# Patient Record
Sex: Female | Born: 1990 | Race: Black or African American | Hispanic: No | Marital: Single | Smoking: Former smoker
Health system: Southern US, Community
[De-identification: ages and names within clinical notes are randomized; demographics above are authoritative.]

## PROBLEM LIST (undated history)

## (undated) DIAGNOSIS — Z87440 Personal history of urinary (tract) infections: Secondary | ICD-10-CM

## (undated) DIAGNOSIS — Z8619 Personal history of other infectious and parasitic diseases: Secondary | ICD-10-CM

## (undated) DIAGNOSIS — F32A Depression, unspecified: Secondary | ICD-10-CM

## (undated) DIAGNOSIS — F329 Major depressive disorder, single episode, unspecified: Secondary | ICD-10-CM

## (undated) HISTORY — PX: WISDOM TOOTH EXTRACTION: SHX21

## (undated) HISTORY — DX: Personal history of other infectious and parasitic diseases: Z86.19

## (undated) HISTORY — DX: Depression, unspecified: F32.A

## (undated) HISTORY — DX: Personal history of urinary (tract) infections: Z87.440

## (undated) HISTORY — PX: NO PAST SURGERIES: SHX2092

## (undated) HISTORY — DX: Major depressive disorder, single episode, unspecified: F32.9

---
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
## 2008-04-12 IMAGING — CT CT HEAD W/O CM
1 series · 16 of 30 positions shown, 20 images · IV contrast (agent unspecified)
Comparison: none

CLINICAL DATA: Posterior headache. 
 HEAD CT WITHOUT CONTRAST:
TECHNIQUE: Contiguous axial images were obtained from the base of the skull through the vertex according to standard protocol without contrast.

[Series 2: headseq 4.8 h37s · axial · 0.44mm/px · z∈[+102,+231]mm · 16 of 30 slices shown, 20 images]
[im 2/30  brain]
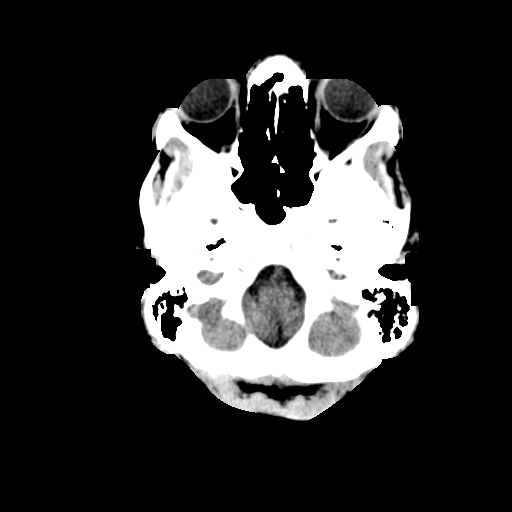
[im 2/30  bone]
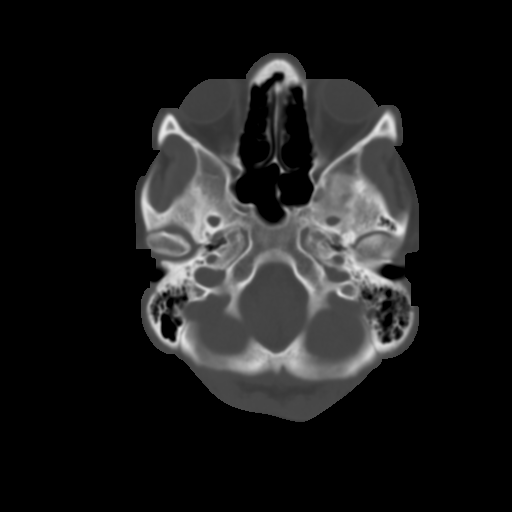
[im 4/30  brain]
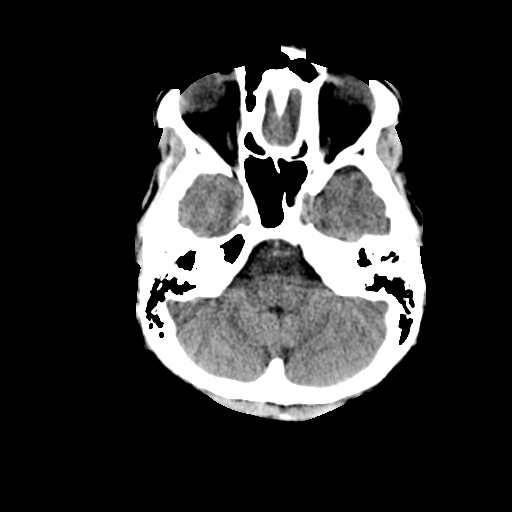
[im 6/30  brain]
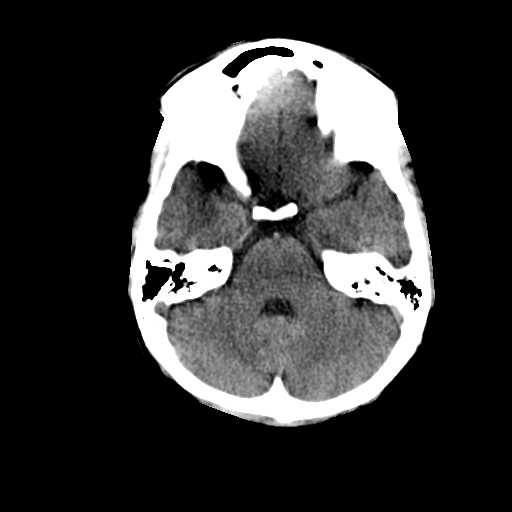
[im 8/30  brain]
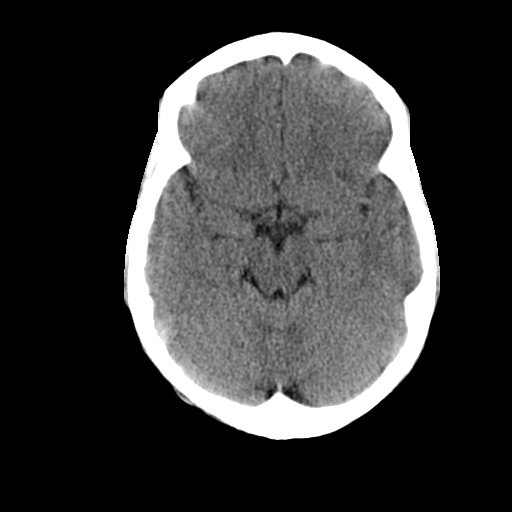
[im 9/30  brain]
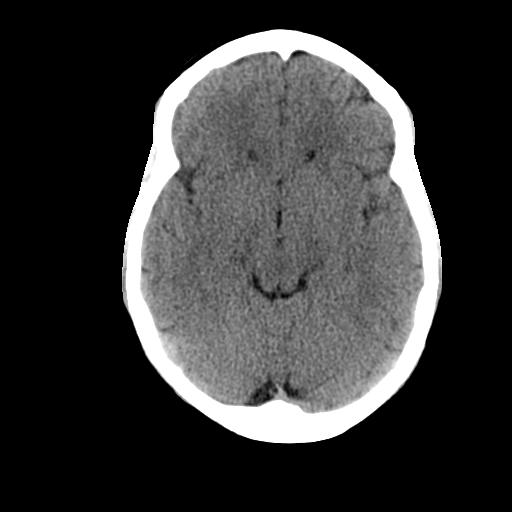
[im 9/30  bone]
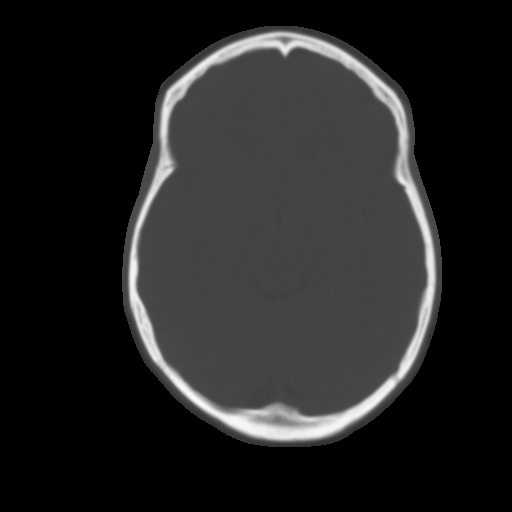
[im 11/30  brain]
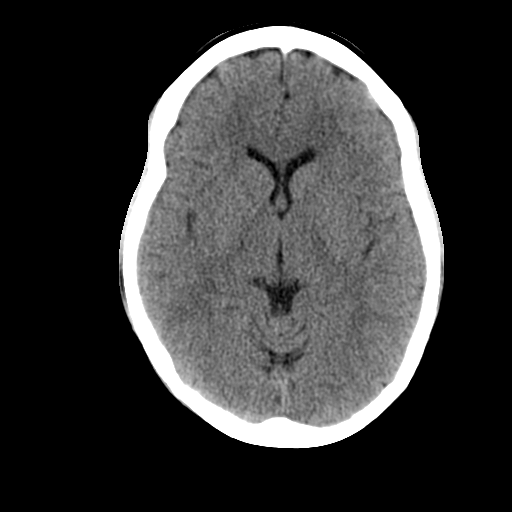
[im 13/30  brain]
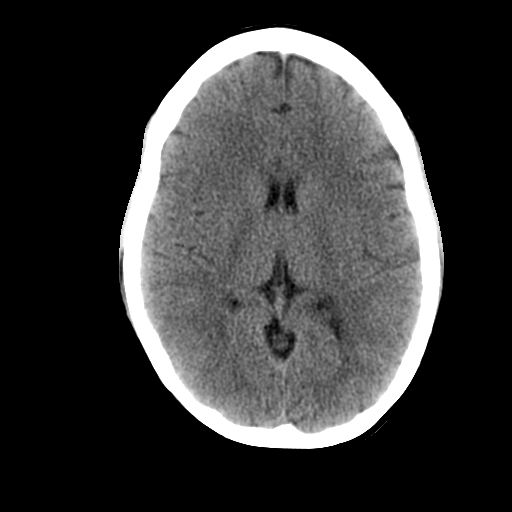
[im 15/30  brain]
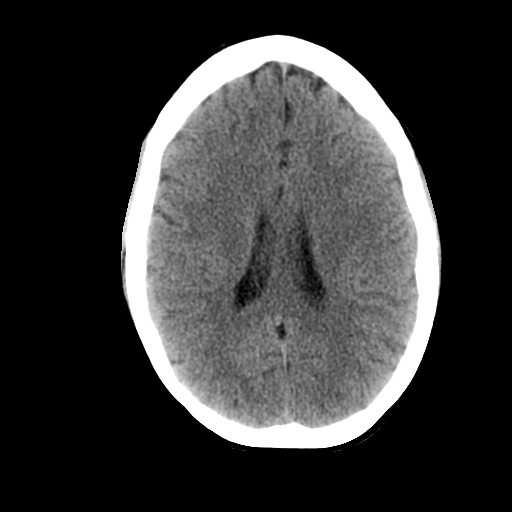
[im 16/30  brain]
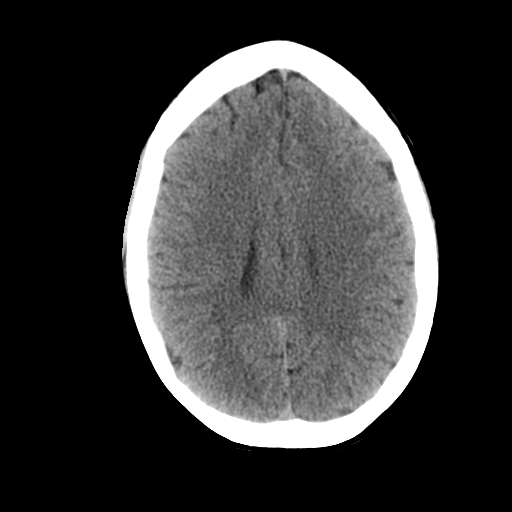
[im 16/30  bone]
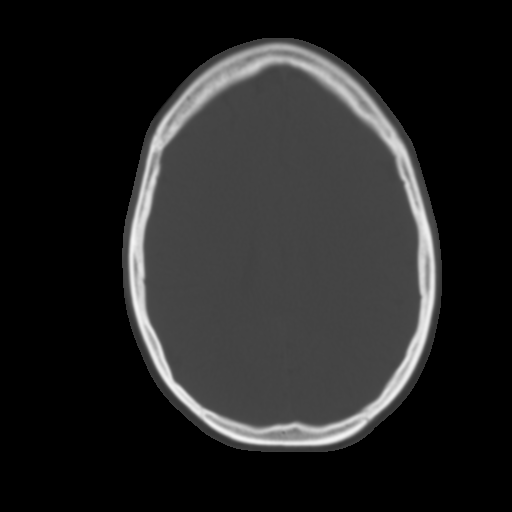
[im 18/30  brain]
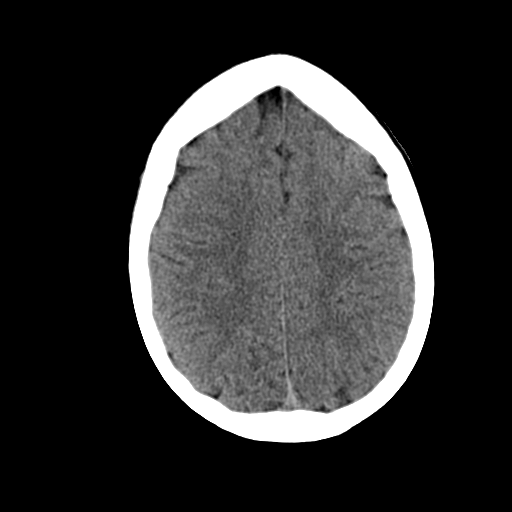
[im 20/30  brain]
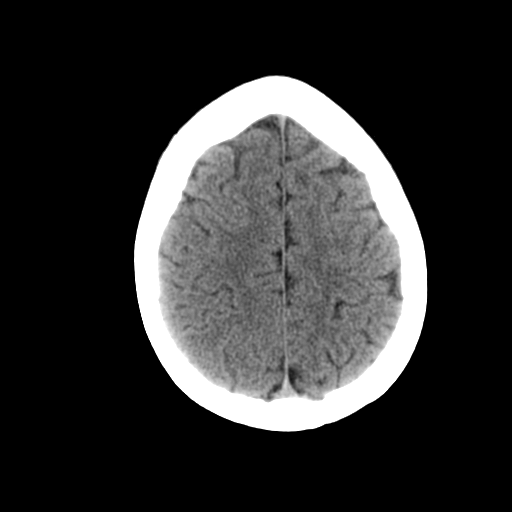
[im 22/30  brain]
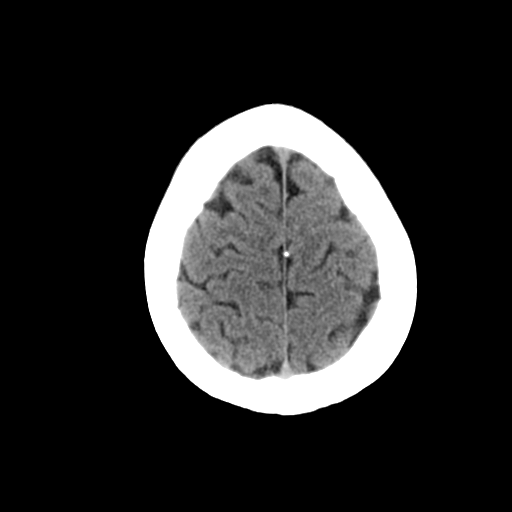
[im 23/30  brain]
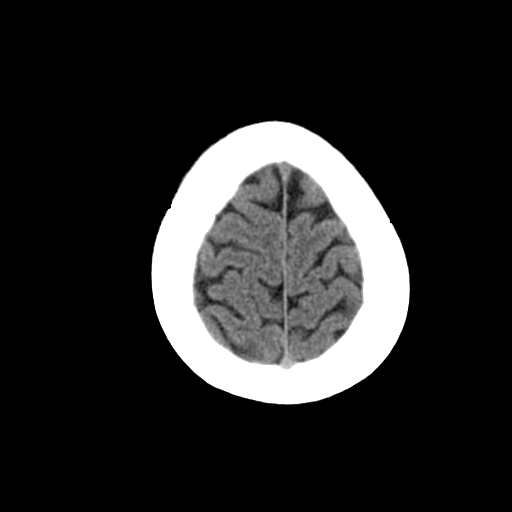
[im 23/30  bone]
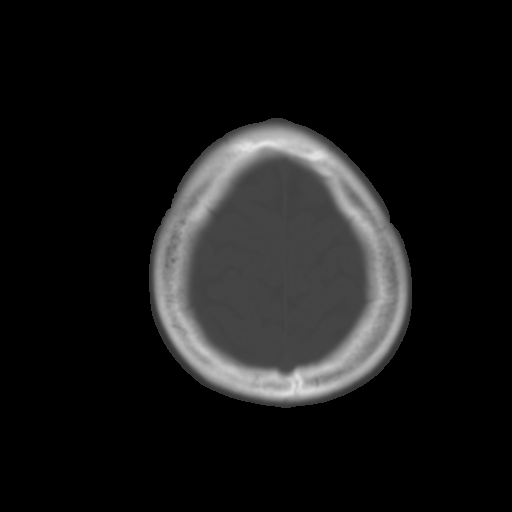
[im 25/30  brain]
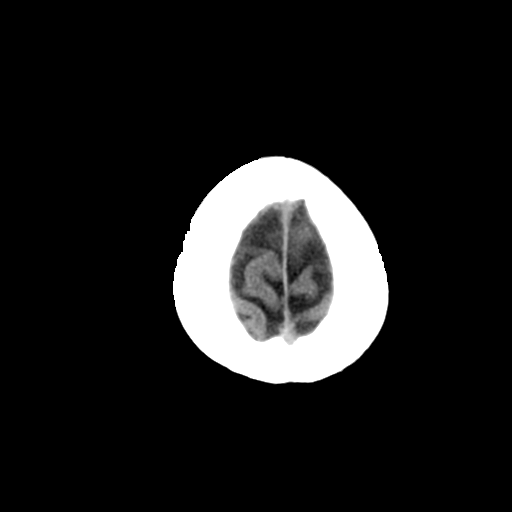
[im 27/30  brain]
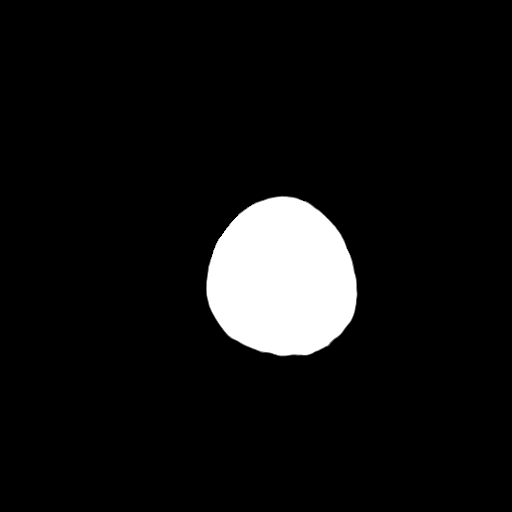
[im 29/30  brain]
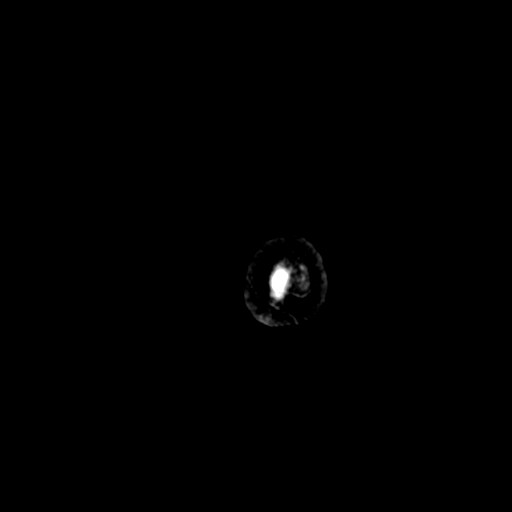

[16 of 30 positions shown; findings below may reference images not displayed]

FINDINGS: There is no evidence of intracranial hemorrhage, brain edema, acute infarct, mass lesion, or mass effect.  No other intra-axial abnormalities are seen, and the ventricles are within normal limits.  No abnormal extra-axial fluid collections or masses are identified.  No skull abnormalities are noted.
IMPRESSION: Negative non-contrast head CT.

## 2011-05-10 LAB — ANTIBODY SCREEN: Antibody Screen: NEGATIVE

## 2011-05-10 LAB — HEPATITIS B SURFACE ANTIGEN: Hepatitis B Surface Ag: NEGATIVE

## 2011-05-10 LAB — ABO/RH: RH Type: NEGATIVE

## 2011-05-10 LAB — RPR: RPR: NONREACTIVE

## 2011-06-04 LAB — STREP B DNA PROBE: GBS: POSITIVE

## 2011-06-05 NOTE — L&D Delivery Note (Signed)
Requested to attend urgent C/S for fetal interest and malpresentation of twin B in breech position. Mother has been on antenatal unit for several weeks and received BMZ during that interval. She presented with preterm labor and bulging bag of water and has been on Magnesium sulfate and Pen G initially and then restarted on Magnesium sulfate this AM for neuroprotection.  Twins are Di-Di.  At delivery twin A was in vertex presentation and was manually expressed with lusty cries and active tone observed. Placed under radiant warmer and vigorously dried and provided tactile stimulation and bulb suction of naso/oropharynx yielding minimal fluid.     Infant maintained spontaneous respirations without assistance until about 3 1/2-4 minutes of age when there were brief apneic events and deeper intercostal retractions. Bag/mask IPPV was briefly applied and then switched to [place] to provide CPAP.  Infant stabilized and continued to move extremities actively. There were no dysmorphic features and infant did not void during stabilization.    Placed in transport isolette with his sibling, infant was shown briefly to mother and then transported accompanied by father  to NICU for admission.   Dagoberto Ligas MD [facility]

## 2011-08-23 ENCOUNTER — Encounter: Payer: Medicaid Other | Admitting: Obstetrics and Gynecology

## 2011-08-23 ENCOUNTER — Inpatient Hospital Stay (HOSPITAL_COMMUNITY)
Admission: AD | Admit: 2011-08-23 | Discharge: 2011-09-08 | DRG: 765 | Disposition: A | Discharge status: Home / Self Care | Payer: Medicaid Other | Source: Ambulatory Visit | Attending: Obstetrics and Gynecology | Admitting: Obstetrics and Gynecology

## 2011-08-23 ENCOUNTER — Encounter (HOSPITAL_COMMUNITY): Payer: Self-pay | Admitting: *Deleted

## 2011-08-23 ENCOUNTER — Encounter: Payer: Self-pay | Admitting: Obstetrics and Gynecology

## 2011-08-23 ENCOUNTER — Encounter (INDEPENDENT_AMBULATORY_CARE_PROVIDER_SITE_OTHER): Payer: Medicaid Other

## 2011-08-23 ENCOUNTER — Other Ambulatory Visit: Payer: Self-pay | Admitting: Obstetrics and Gynecology

## 2011-08-23 DIAGNOSIS — O30049 Twin pregnancy, dichorionic/diamniotic, unspecified trimester: Secondary | ICD-10-CM

## 2011-08-23 DIAGNOSIS — O30009 Twin pregnancy, unspecified number of placenta and unspecified number of amniotic sacs, unspecified trimester: Secondary | ICD-10-CM | POA: Diagnosis present

## 2011-08-23 DIAGNOSIS — O4700 False labor before 37 completed weeks of gestation, unspecified trimester: Secondary | ICD-10-CM

## 2011-08-23 DIAGNOSIS — K5900 Constipation, unspecified: Secondary | ICD-10-CM | POA: Diagnosis present

## 2011-08-23 DIAGNOSIS — D649 Anemia, unspecified: Secondary | ICD-10-CM | POA: Diagnosis present

## 2011-08-23 DIAGNOSIS — O3090 Multiple gestation, unspecified, unspecified trimester: Principal | ICD-10-CM | POA: Diagnosis present

## 2011-08-23 LAB — CBC
HCT: 32.9 % — ABNORMAL LOW (ref 36.0–46.0)
Hemoglobin: 10.9 g/dL — ABNORMAL LOW (ref 12.0–15.0)
MCV: 74.3 fL — ABNORMAL LOW (ref 78.0–100.0)
RBC: 4.43 MIL/uL (ref 3.87–5.11)
RDW: 14.9 % (ref 11.5–15.5)
WBC: 17.4 10*3/uL — ABNORMAL HIGH (ref 4.0–10.5)

## 2011-08-23 MED ORDER — BETAMETHASONE SOD PHOS & ACET 6 (3-3) MG/ML IJ SUSP
12.0000 mg | Freq: Once | INTRAMUSCULAR | Status: AC
Start: 2011-08-23 — End: 2011-08-24
  Administered 2011-08-24: 12 mg via INTRAMUSCULAR
  Filled 2011-08-23: qty 2

## 2011-08-23 MED ORDER — ACETAMINOPHEN 325 MG PO TABS: 650.0000 mg | ORAL_TABLET | ORAL | Status: DC | PRN

## 2011-08-23 MED ORDER — PRENATAL MULTIVITAMIN CH: 1.0000 | ORAL_TABLET | Freq: Every day | ORAL | Status: DC

## 2011-08-23 MED ORDER — PRENATAL MULTIVITAMIN CH
1.0000 | ORAL_TABLET | Freq: Every day | ORAL | Status: DC
  Administered 2011-08-23 – 2011-09-04 (×13): 1 via ORAL
  Filled 2011-08-23 (×13): qty 1

## 2011-08-23 MED ORDER — DOCUSATE SODIUM 100 MG PO CAPS
100.0000 mg | ORAL_CAPSULE | Freq: Every day | ORAL | Status: DC
  Administered 2011-08-23 – 2011-09-04 (×12): 100 mg via ORAL
  Filled 2011-08-23 (×12): qty 1

## 2011-08-23 MED ORDER — CALCIUM CARBONATE ANTACID 500 MG PO CHEW
2.0000 | CHEWABLE_TABLET | ORAL | Status: DC | PRN
  Administered 2011-08-26 – 2011-08-29 (×5): 400 mg via ORAL
  Filled 2011-08-23 (×5): qty 2

## 2011-08-23 MED ORDER — MAGNESIUM SULFATE BOLUS VIA INFUSION
4.0000 g | Freq: Once | INTRAVENOUS | Status: AC
  Administered 2011-08-23: 4 g via INTRAVENOUS
  Filled 2011-08-23: qty 500

## 2011-08-23 MED ORDER — MAGNESIUM SULFATE 40 G IN LACTATED RINGERS - SIMPLE
1.0000 g/h | INTRAVENOUS | Status: DC
  Administered 2011-08-24 – 2011-08-25 (×2): 2.5 g/h via INTRAVENOUS
  Administered 2011-08-26: 2 g/h via INTRAVENOUS
  Administered 2011-08-26: 1.5 g/h via INTRAVENOUS
  Administered 2011-08-26: 1 g/h via INTRAVENOUS
  Filled 2011-08-23 (×4): qty 500

## 2011-08-23 MED ORDER — LACTATED RINGERS IV SOLN
INTRAVENOUS | Status: DC
  Administered 2011-08-23 – 2011-08-26 (×5): via INTRAVENOUS
  Administered 2011-08-26: 100 mL/h via INTRAVENOUS
  Administered 2011-08-27 – 2011-09-05 (×25): via INTRAVENOUS

## 2011-08-23 MED ORDER — PENICILLIN G POTASSIUM 5000000 UNITS IJ SOLR
2.5000 10*6.[IU] | INTRAVENOUS | Status: DC
  Administered 2011-08-23 – 2011-08-28 (×29): 2.5 10*6.[IU] via INTRAVENOUS
  Filled 2011-08-23 (×30): qty 2.5

## 2011-08-23 MED ORDER — BETAMETHASONE SOD PHOS & ACET 6 (3-3) MG/ML IJ SUSP
12.0000 mg | Freq: Once | INTRAMUSCULAR | Status: AC
  Administered 2011-08-23 – 2011-08-24 (×2): 12 mg via INTRAMUSCULAR
  Filled 2011-08-23: qty 2

## 2011-08-23 MED ORDER — DOCUSATE SODIUM 100 MG PO CAPS: 100.0000 mg | ORAL_CAPSULE | Freq: Every day | ORAL | Status: DC

## 2011-08-23 MED ORDER — PENICILLIN G POTASSIUM 5000000 UNITS IJ SOLR
5.0000 10*6.[IU] | Freq: Once | INTRAVENOUS | Status: AC
  Administered 2011-08-23: 5 10*6.[IU] via INTRAVENOUS
  Filled 2011-08-23: qty 5

## 2011-08-23 MED ORDER — ZOLPIDEM TARTRATE 10 MG PO TABS
10.0000 mg | ORAL_TABLET | Freq: Every evening | ORAL | Status: DC | PRN
  Administered 2011-08-23 – 2011-09-04 (×12): 10 mg via ORAL
  Filled 2011-08-23 (×13): qty 1

## 2011-08-23 NOTE — Plan of Care (Signed)
Problem: Consults Goal: Birthing Suites Patient Information Press F2 to bring up selections list   Pt < [redacted] weeks EGA     

## 2011-08-23 NOTE — Progress Notes (Signed)
Patient ID: [place], female   DOB: [birth year], 21 y.o.   MRN: [id number] Pt without complaints.  No leakage of fluid or VB.  Good FM  BP 126/75  Pulse 92  Temp(Src) 98.4 F (36.9 C) (Oral)  Resp 18  Ht 5\' 3"  (1.6 m)  Wt 67.132 kg (148 lb)  BMI 26.22 kg/m2  FHTS Baseline: a 145 b 150 bpm, Variability: Fair (1-6 bpm), Accelerations: Non-reactive but appropriate for gestational age and Decelerations: Absent  Toco regular, every 2-5 minutes  Pt in NAD CV RRR Lungs CTAB abd  Gravid soft and NT GU no vb EXt no calf tenderness Results for orders placed during the hospital encounter of 08/23/11 (from the past 72 hour(s))  CBC     Status: Abnormal   Collection Time   08/23/11  3:00 PM      Component Value Range Comment   WBC 17.4 (*) 4.0 - 10.5 (K/uL)    RBC 4.43  3.87 - 5.11 (MIL/uL)    Hemoglobin 10.9 (*) 12.0 - 15.0 (g/dL)    HCT 40.9 (*) 81.1 - 46.0 (%)    MCV 74.3 (*) 78.0 - 100.0 (fL)    MCH 24.6 (*) 26.0 - 34.0 (pg)    MCHC 33.1  30.0 - 36.0 (g/dL)    RDW 91.4  78.2 - 95.6 (%)    Platelets 267  150 - 400 (K/uL)     Assessment and Plan [redacted]w[redacted]d  increase magnesium sulfate to 2.5 grams per hour.

## 2011-08-23 NOTE — H&P (Signed)
  [name] is April 08, 1991  No LMP recorded. Patient is pregnant. [redacted]w[redacted]d  Pt presents with PTL and twins.  Pt was seen at the office by DR Su Hilt and found to be 4 cm with bulging membranes.    Pregnancy complicated by GBS bacturia History of tobacco, ETOH and and THC use .  All stopped with positive pregnancy test Twins DI/Di PMHX  Past Medical History  Diagnosis Date  . No pertinent past medical history     PSURG HX  Past Surgical History  Procedure Date  . Wisdom tooth extraction   . No past surgeries     Fam HX No family history on file.  Soc HX  History   Social History  . Marital Status: Single    Spouse Name: N/A    Number of Children: N/A  . Years of Education: N/A   Occupational History  . 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 Smoking status: Former Smoker -- 0.5 packs/day for 2 years    Types: Cigarettes    Quit date: 03/25/2011  . Smokeless tobacco: Never Used  . Alcohol Use: No  . Drug Use: No  . Sexually Active: Yes -- Female partner(s)   Other Topics Concern  . Not on file   Social History Narrative  . No narrative on file    ROS  Review of Systems - General ROS: negative Psychological ROS: negative Respiratory ROS: no cough, shortness of breath, or wheezing Cardiovascular ROS: no chest pain or dyspnea on exertion Gastrointestinal ROS: no abdominal pain, change in bowel habits, or black or bloody stools Musculoskeletal ROS: negative    IUP [redacted]w[redacted]d  Admit to antepartum Beta methasone MGSO if contracting Trendelenburg Plan cesarean if patient labors PCNG for known GBS Pt agrees with plan NICU consult  [name] is 1991/05/11   Past Surgical History  Procedure Date  . Wisdom tooth extraction   . No past surgeries     Fam HX No family history on file.  Soc HX  History   Social History  . Marital Status: Single    Spouse Name: N/A    Number of Children: N/A  . Years of Education: N/A    Occupational History  . 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 Smoking status: Former Smoker -- 0.5 packs/day for 2 years    Types: Cigarettes    Quit date: 03/25/2011  . Smokeless tobacco: Never Used  . Alcohol Use: No  . Drug Use: No  . Sexually Active: Yes -- Female partner(s)   Other Topics Concern  . Not on file   Social History Narrative  . No narrative on file    ROS  Review of Systems - History obtained from the patient Respiratory ROS: no cough, shortness of breath, or wheezing Cardiovascular ROS: no chest pain or dyspnea on exertion Gastrointestinal ROS: no abdominal pain, change in bowel habits, or black or bloody stools Musculoskeletal ROS: negative  [place] SIUP a vtx b breech position.  AFI   EFW   %  Gen WDWN  IN NAD CV RRR LUNGS CTAB ABD Gravid soft NT GU bulging membranes on spec exam and cx 4cm per Dr Su Hilt EXT no c/c A/P: IUP [redacted]w[redacted]d twins PTL Beta methasone Magnesium tocolysis if needed Bed rest urnine culture  Admit to antepartum

## 2011-08-24 ENCOUNTER — Encounter: Payer: Medicaid Other | Admitting: Obstetrics and Gynecology

## 2011-08-24 LAB — GC/CHLAMYDIA PROBE AMP, URINE
Chlamydia, Swab/Urine, PCR: NEGATIVE
GC Probe Amp, Urine: NEGATIVE

## 2011-08-24 NOTE — Progress Notes (Signed)
UR Chart review completed.  

## 2011-08-24 NOTE — Progress Notes (Signed)
Patient ID: [place], female   DOB: Jul 19, 1990, 21 y.o.   MRN: [id number] [place] Jerde is a 21 y.o. G1P0000 at [redacted]w[redacted]d admitted for Preterm labor  Hospital Day No: 2  Subjective: No complaints.  Not feeling contractions.  Denies change in discharge, vb and reports FM x 2.  Prenatal labs:  Pregnancy complications: multiple gestation, preterm labor  Objective: BP 108/56  Pulse 125  Temp(Src) 98.4 F (36.9 C) (Oral)  Resp 18  Ht 5\' 3"  (1.6 m)  Wt 148 lb (67.132 kg)  BMI 26.22 kg/m2  SpO2 96% I/O last 3 completed shifts: In: 3892 [P.O.:1400; I.V.:1842; IV Piggyback:650] Out: 2620 [Urine:2620] Total I/O In: 854.9 [P.O.:480; I.V.:374.9] Out: 1100 [Urine:1100]  Physical Exam:  Gen: alert and oriented in trendelenberg Chest/Lungs: cta bilaterally  Heart/Pulse: RRR  Abdomen: soft, gravid, nontender, BX x4 quad Uterine fundus: soft, nontender Skin & Color: warm and dry  Neurological: AOx3, DTRs 1-2+ EXT: negative Homan's b/l, edema neg  FHT:  FHR: 130s-140s bpm, variability: moderate,  accelerations:  Present,  decelerations:  Absent x 2 UC:   irregular SVE:    deferred  Labs: Lab Results  Component Value Date   WBC 17.4* 08/23/2011   HGB 10.9* 08/23/2011   HCT 32.9* 08/23/2011   MCV 74.3* 08/23/2011   PLT 267 08/23/2011    Assessment and Plan: P0 at 26 3/7wks admitted in PTL undergoing BMZ course while on MgSO4 and antibiotic course for GBS +.  Fetal status x 2 is reassuring.  Continue current plan.   Kearstin Learn Y 08/24/2011, 11:59 AM

## 2011-08-24 NOTE — Progress Notes (Signed)
Visited with pt at nurse's referral.  Pt was in good spirits but was frustrated by having to be in an uncomfortable position.  We shared a prayer for her babies, [place] and [place], along with FOB, Reuel Boom.    Please page as needed or as pt requests.  Chaplain Dyanne Carrel 4:17 PM   08/24/11 1600  Clinical Encounter Type  Visited With Patient and family together  Visit Type Spiritual support  Referral From Nurse  Spiritual Encounters  Spiritual Needs Prayer

## 2011-08-24 NOTE — Consult Note (Signed)
Neonatology Consult to Antenatal Patient:  [name] [name] is admitted yesterday at 69 2/[redacted] weeks GA with twin gestation and preterm labor. She is currently 4 cm dilated with a bulging amniotic sac and not  having active labor. She is getting Magnesium sulfate, BMZ and IV Pen G. The infants are both female.  I spoke with the patient alone. We discussed the worst case of delivery in the next 1-2 days, including usual DR management, possible respiratory complications and need for support, IV access, feedings (mother desires breast feeding, which was encouraged), LOS, Mortality and Morbidity, and long term outcomes. She did not have any questions at this time. I offered a NICU tour to any interested family members and would be glad to come back if she has more questions later.  Thank you for asking me to see this patient.  Deatra James, MD Neonatologist  Time spent: 20 minutes [phone])

## 2011-08-25 LAB — URINE CULTURE
Colony Count: NO GROWTH
Culture  Setup Time: 201303220228
Culture: NO GROWTH

## 2011-08-25 MED ORDER — PSEUDOEPHEDRINE HCL 30 MG PO TABS: 30.0000 mg | ORAL_TABLET | Freq: Four times a day (QID) | ORAL | Status: DC | PRN

## 2011-08-25 NOTE — Progress Notes (Signed)
Patient ID: [place], female   DOB: Oct 12, 1990, 21 y.o.   MRN: [id number] [name] [name] is a 21 y.o. G1P0000 at [redacted]w[redacted]d admitted for Preterm labor  Hospital Day No: 3  Subjective: No change in discharge.  Pt felt one contraction this morning and feels a little stuffy in the nose.  Pregnancy complications: preterm labor  Objective: BP 105/60  Pulse 114  Temp(Src) 98 F (36.7 C) (Oral)  Resp 18  Ht 5\' 3"  (1.6 m)  Wt 148 lb (67.132 kg)  BMI 26.22 kg/m2  SpO2 98% I/O last 3 completed shifts: In: 7997.9 [P.O.:2840; I.V.:4257.9; IV Piggyback:900] Out: 8070 [Urine:8070] Total I/O In: 1366.1 [P.O.:960; I.V.:406.1] Out: 950 [Urine:950]  Physical Exam:  Gen: alert and oriented Chest/Lungs: cta bilaterally  Heart/Pulse: RRR  Abdomen: soft, gravid, nontender, BX x4 quad Uterine fundus: soft, nontender Skin & Color: warm and dry  Neurological: AOx3, DTRs 1+ EXT: negative Homan's b/l, edema neg  FHT: 140s bpm, variability: moderate,  accelerations:  Present,  decelerations:  Rare variable (x2) UC:   rare SVE:    deferred  Labs: Lab Results  Component Value Date   WBC 17.4* 08/23/2011   HGB 10.9* 08/23/2011   HCT 32.9* 08/23/2011   MCV 74.3* 08/23/2011   PLT 267 08/23/2011    Assessment and Plan: PTL stable on Mg.  Will decrease to 2g/hr.  FHT overall reassuring x 2.  Will do NSTs q shift and continuous toco.  Sudafed prn for stuffy nose.  Continue strict bedrest.  Will do GCT tomorrow and MFM consult on Monday.  If still pregnant, U/S for EFW on 09/13/11.  Purcell Nails 08/25/2011, 12:53 PM

## 2011-08-26 NOTE — Progress Notes (Signed)
S Lillard notified of FBS results.  No orders

## 2011-08-26 NOTE — Progress Notes (Signed)
5 ounces of glucola given to pt

## 2011-08-26 NOTE — Progress Notes (Addendum)
Patient ID: [place], female   DOB: January 07, 1991, 21 y.o.   MRN: [id number] [name] [name] is a 21 y.o. G1P0000 at [redacted]w[redacted]d admitted for Preterm labor  Hospital Day No: 4  Subjective: No complaints. No change in discharge.  She denies feeling any contractions or leakage of fluid or vb.  + FM.  Pregnancy complications: preterm labor  Objective: BP 116/56  Pulse 111  Temp(Src) 98.3 F (36.8 C) (Oral)  Resp 20  Ht 5\' 3"  (1.6 m)  Wt 144 lb 12.8 oz (65.681 kg)  BMI 25.65 kg/m2  SpO2 99% I/O last 3 completed shifts: In: 7945.5 [P.O.:2940; I.V.:4405.5; IV Piggyback:600] Out: 8775 [Urine:8775] Total I/O In: 1657.1 [P.O.:930; I.V.:627.1; IV Piggyback:100] Out: 1725 [Urine:1725]  Physical Exam:  Gen: alert Chest/Lungs: cta bilaterally  Heart/Pulse: RRR  Abdomen: soft, gravid, nontender, BX x4 quad Uterine fundus: soft, nontender Skin & Color: warm and dry  Neurological: AOx3, DTRs 1+ EXT: negative Homan's b/l, edema neg  FHT:  FHR: 140s-150 bpm, variability: moderate,  accelerations:  Present,  decelerations:  Absent UC:   none SVE:    deferred  Labs: Lab Results  Component Value Date   WBC 17.4* 08/23/2011   HGB 10.9* 08/23/2011   HCT 32.9* 08/23/2011   MCV 74.3* 08/23/2011   PLT 267 08/23/2011    Assessment and Plan: P0 at 26 5/7 wks admitted with PTL and advanced cervical dilation.  Currently stable.  GCT likely falsely elevated s/p 2nd BMZ shot on Friday.  Will repeat 09/08/11.  Will appreciate MFM recs regarding MgSO4 discontinuation tomorrow.  Pt is reluctant to have it stopped for fear of return of contractions and delivery.  She understands secondary to vtx/breech and prematurity that a c-section is recommended.  Will repeat EFW 4/11.  Fetal status x 2 is overall reassuring.  Questions answered.   [name] [name] Y 08/26/2011, 1:29 PM  10:15p After Mg decreased to 1g/hr patient started to complain of increased ctxs with increased pain.  Mg increased to 1.5g/hr.

## 2011-08-26 NOTE — Progress Notes (Signed)
New bed calabrated.  Pt moved to calibrated bed and daily wt taken.

## 2011-08-26 NOTE — Progress Notes (Signed)
Pt states uc's "lighter" now than this AM. (ie less noticeable)

## 2011-08-26 NOTE — Progress Notes (Signed)
cardios removed 

## 2011-08-26 NOTE — Progress Notes (Signed)
Pt feels she may be contracting

## 2011-08-27 ENCOUNTER — Inpatient Hospital Stay (HOSPITAL_COMMUNITY): Payer: Medicaid Other

## 2011-08-27 DIAGNOSIS — O30009 Twin pregnancy, unspecified number of placenta and unspecified number of amniotic sacs, unspecified trimester: Secondary | ICD-10-CM | POA: Diagnosis present

## 2011-08-27 MED ORDER — IBUPROFEN 800 MG PO TABS
800.0000 mg | ORAL_TABLET | Freq: Once | ORAL | Status: AC
  Administered 2011-08-27: 800 mg via ORAL
  Filled 2011-08-27: qty 1

## 2011-08-27 NOTE — Progress Notes (Signed)
At 1850 Dr. Pennie Rushing called after reviewing patient's strip. She gave RN instructions to let patient eat (transtion back to regular diet) if she has less than 3 contractions in the next 30 minutes. Patient had more than 3 contractions in this time period, so patient was kept NPO. RN related these instructions to night shift RN.

## 2011-08-27 NOTE — Progress Notes (Signed)
MFM Consult  21 y/o G1 at 74 6/7 weeks with dichorionic twin gestation admitted with PTL and advanced cervical dilation. Cervix 4cm with bulging membranes on presentation.   Patient has received a course of betamethasone and remains on magnesium tocolysis.  She has had some intermittent uterine contractions but no consistent subjective uterine activity.    Patient with no significant medical or surgical history.   [place] in primary office on 08/23/11 with normal growth and amniotic fluid volume for both twins.  WBC 17.4K on admission.  Uterus nontender.  Patient AF.  RECOMMENDATIONS  -Agree with plan to discontinue magnesium tocolysis even in the setting of some ongoing uterine activity.  Tocolysis has greatest benefit during steroid administration.  Now that this is complete, maternal risks outweigh any benefits provided by long term magnesium tocolysis.  If patient becomes symptomatic with discomfort related to uterine activity but labor does not progress, PRN oral procardia could be attempted for maternal relief (10-20 mg po every 6 hours PRN).  -If progressing labor develops before 32 weeks, recommend re starting magnesium for neuro protection.    -Given this early gestational age with advanced cervical dilation, recommend inpatient observation until a prolonged period of stability is achieved (10-14 days).  If patient is clinically stable after this period of time, re evaluation for potential outpatient management could be considered, if social circumstances are favorable.    -Agree with plan to repeat ultrasound evaluation of fetal growth in 3-4 weeks.    -Monitor fetuses at least daily and with any persistent episode of uterine activity.   Follow closely for evidence of intrauterine infection.   -When delivered, recommend cesarean delivery for malpresenting second twin with EFW <1500 grams.  If both twins are presenting cephalic, recommend attempt at vaginal delivery.  If EFW for both twins is  >1500gms at time of delivery, breech extraction could be attempted for second twin, if growth is concordant.  -Recommend SCDs for DVT prophylaxis.  Recommendations discussed with patient and questions answered.  Please contact us at any time if we can be of assistance with this or any other patient you may have.  30 min

## 2011-08-27 NOTE — Progress Notes (Signed)
UR chart review completed.  

## 2011-08-27 NOTE — Progress Notes (Signed)
Called shelley cnm, informed that pt has been on 1g/hr magnesium, previous RN never increased to 1.5g/hr as per [facility] stated, orders received to just keep pt at 1g/hr.

## 2011-08-27 NOTE — Progress Notes (Signed)
Called shelley cnm  back, to watch and see how much pt contracts this hour and call her back. Pt seems to be slowing down again with uc's.

## 2011-08-27 NOTE — Progress Notes (Signed)
Shelley cnm on unit, called dr Su Hilt about pts uc pattern, if uc;s get closer or pt is awakened and gets uncomfortable to call cnm.

## 2011-08-27 NOTE — Progress Notes (Signed)
[name] [name] is a 21 y.o. G1P0000 at [redacted]w[redacted]d by LMP admitted for Preterm labor  Subjective: GI: negative GU: : Denies: dysuria, frequency/urgency, hematuria, genital discharge, vaginal bleeding, pelvic pain OB: good movement        Objective: BP 120/67  Pulse 102  Temp(Src) 98.4 F (36.9 C) (Oral)  Resp 20  Ht 5\' 3"  (1.6 m)  Wt 148 lb 8 oz (67.359 kg)  BMI 26.31 kg/m2  SpO2 99% I/O last 3 completed shifts: In: 7978.2 [P.O.:2610; I.V.:4568.2; IV Piggyback:800] Out: 9075 [Urine:9075] Total I/O In: 1493 [P.O.:720; I.V.:773] Out: 1750 [Urine:1750]  FHT:  FHR: 14, 150 bpm, variability: minimal ,  accelerations:  Present,  decelerations:  Absent UC:   3-4/ hour of which the patient is unaware SVE:    4cm per Dr. Su Hilt on day of admission.  No exam since then  Labs: Lab Results  Component Value Date   WBC 17.4* 08/23/2011   HGB 10.9* 08/23/2011   HCT 32.9* 08/23/2011   MCV 74.3* 08/23/2011   PLT 267 08/23/2011    Assessment / Plan: Preterm labor Twin gestation [redacted]w[redacted]d GBS bacteriuria  Fetal Wellbeing:  Category II  Plan:   Continue bedrest S/p BMZ MFM consult to advise pt re magnesium discontinuation until needed for neuroprophylaxis.  [name] [name] P 08/27/2011, 2:25 PM

## 2011-08-27 NOTE — Progress Notes (Signed)
Called shelley cnm to look at pt uc pattern, she will call dr Su Hilt to get further orders

## 2011-08-28 MED ORDER — NIFEDIPINE 10 MG PO CAPS
10.0000 mg | ORAL_CAPSULE | ORAL | Status: DC | PRN
  Administered 2011-08-28 – 2011-09-04 (×13): 10 mg via ORAL
  Filled 2011-08-28 (×14): qty 1

## 2011-08-28 MED ORDER — NITROFURANTOIN MONOHYD MACRO 100 MG PO CAPS
100.0000 mg | ORAL_CAPSULE | Freq: Every day | ORAL | Status: DC
  Administered 2011-08-28 – 2011-09-04 (×8): 100 mg via ORAL
  Filled 2011-08-28 (×9): qty 1

## 2011-08-28 NOTE — Progress Notes (Signed)
21 y.o. year old female,at [redacted]w[redacted]d gestation.  SUBJECTIVE:  The patient reports that she is doing well. She had a bowel movement yesterday. She does feel contractions.  OBJECTIVE:  BP 109/63  Pulse 83  Temp(Src) 98.2 F (36.8 C) (Oral)  Resp 18  Ht 5\' 3"  (1.6 m)  Wt 146 lb 3.2 oz (66.316 kg)  BMI 25.90 kg/m2  SpO2 99%  Fetal Heart Tones:  Category 1 for both twins  Contractions:          4-7 contractions each hour  Abdomen is nontender  Extremities are nontender. No masses are present.    ASSESSMENT:  [redacted]w[redacted]d Weeks Pregnancy  Twin gestation  Arrested preterm labor  PLAN:  The patient will remain in the hospital for management of her arrested preterm labor. The patient's urine culture was negative and therefore we will discontinue her penicillin. We will began nitrofurantoin 100 mg each night because she has a Foley catheter in place. We will began Procardia 10 mg every 4 hours as needed for greater than 4-5 contractions each hour.  Leonard Schwartz, M.D.

## 2011-08-28 NOTE — Telephone Encounter (Signed)
Telephone call entered in error.

## 2011-08-28 NOTE — Progress Notes (Signed)
08/28/11 1020  Clinical Encounter Type  Visited With Patient  Visit Type Social support;Spiritual support  Referral From Nurse  Spiritual Encounters  Spiritual Needs Emotional    [name] [name] was positive and in good spirits, trying to take advantage of resting and time off work.  She reports good local support from FOB Reuel Boom, family, friends.  Provided pastoral presence, encouragement.  Pt aware of ongoing chaplain availability.  Avis Epley, [place] Chaplain [phone]

## 2011-08-29 MED ORDER — HYDROCORTISONE 1 % EX CREA
TOPICAL_CREAM | Freq: Two times a day (BID) | CUTANEOUS | Status: DC | PRN
  Administered 2011-08-29 – 2011-09-02 (×2): via TOPICAL
  Filled 2011-08-29: qty 28

## 2011-08-29 NOTE — Progress Notes (Signed)
Patient ID: [place], female   DOB: 09/06/1990, 21 y.o.   MRN: [id number] Pt without complaints.  No leakage of fluid or VB.  Good FM Pt complains of vagina itching  BP 133/65  Pulse 121  Temp(Src) 98.1 F (36.7 C) (Oral)  Resp 18  Ht 5\' 3"  (1.6 m)  Wt 66.316 kg (146 lb 3.2 oz)  BMI 25.90 kg/m2  SpO2 99%  FHTS Baseline: a 150 b145 bpm, Variability: Good {> 6 bpm), Accelerations: Non-reactive but appropriate for gestational age and Decelerations: Absent  Toco irregular, every 5-10 minutes  Pt in NAD CV RRR Lungs CTAB abd  Gravid soft and NT GU no vb, vulva appears normal EXt no calf tenderness No results found for this or any previous visit (from the past 72 hour(s)).  Assessment and Plan [redacted]w[redacted]d  Continue care Lotrimin prn itching

## 2011-08-30 DIAGNOSIS — K5900 Constipation, unspecified: Secondary | ICD-10-CM | POA: Diagnosis present

## 2011-08-30 MED ORDER — POLYETHYLENE GLYCOL 3350 17 G PO PACK
17.0000 g | PACK | Freq: Every day | ORAL | Status: DC | PRN
  Filled 2011-08-30: qty 1

## 2011-08-30 NOTE — Progress Notes (Addendum)
[name] [name] is a 21 y.o. G1P0000 at [redacted]w[redacted]d by LMP admitted for Preterm labor with twin gestation  Subjective: GI: constipation GU: Denies: dysuria, frequency/urgency, hematuria, genital discharge, vaginal bleeding, pelvic pain OB: good movements        Objective: BP 119/75  Pulse 90  Temp(Src) 98.2 F (36.8 C) (Oral)  Resp 16  Ht 5\' 3"  (1.6 m)  Wt 146 lb 3.2 oz (66.316 kg)  BMI 25.90 kg/m2  SpO2 99% I/O last 3 completed shifts: In: -  Out: 7600 [Urine:7600]    FHT:  pending UC:   irreg SVE:    deferred  Labs: Lab Results  Component Value Date   WBC 17.4* 08/23/2011   HGB 10.9* 08/23/2011   HCT 32.9* 08/23/2011   MCV 74.3* 08/23/2011   PLT 267 08/23/2011    Assessment / Plan: preterm labor with twin gestation  Fetal Wellbeing:  pending  Plan: Continue current management Miralax prn   Tache Bobst P 08/30/2011, 9:47 AM  Fetal heart rate tracing BABY A: 140s to 150s with accelerations and no significant decelerations                                       Baby B: 160s with accelerations and no significant decelerations

## 2011-08-30 NOTE — Progress Notes (Signed)
EFM data lost from [phone] due to inadvertently being place on hold.

## 2011-08-31 MED ORDER — FLUCONAZOLE 150 MG PO TABS
150.0000 mg | ORAL_TABLET | Freq: Every day | ORAL | Status: DC
  Administered 2011-08-31: 150 mg via ORAL
  Filled 2011-08-31 (×3): qty 1

## 2011-08-31 NOTE — Progress Notes (Signed)
UR Chart review completed.  
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
## 2011-09-02 MED ORDER — RHO D IMMUNE GLOBULIN 1500 UNIT/2ML IJ SOLN: 300.0000 ug | Freq: Once | INTRAMUSCULAR | Status: DC

## 2011-09-02 NOTE — Progress Notes (Addendum)
Hospital day # 10 pregnancy at [redacted]w[redacted]d--Twins with PTL/cervical dilation  S: Doing well, reports good fetal activity      Contractions: Occasional, mild      Vaginal bleeding:  None       Vaginal discharge:  None      Patient alone at present--reports no issues in relationship with FOB  O: BP 108/56  Pulse 106  Temp(Src) 98.5 F (36.9 C) (Oral)  Resp 18  Ht 5\' 3"  (1.6 m)  Wt 151 lb (68.493 kg)  BMI 26.75 kg/m2  SpO2 99%      Fetal tracings: Category 1 both babies on TID NST  Reviewed and reassuring      Uterus non-tender      Extremities: no significant edema and no signs of DVT  A: [redacted]w[redacted]d with twins, PTL/cervical dilation     Stable  P: Continue current plan of care      Glucola scheduled next Saturday      Will plan Rhophylac that day, as well.      Dr. Normand Sloop to make decision regarding allowing patient to shower.      May take ride in bed outside room daily if desired.     Support to patient for long-term hospitalization situation.  Nigel Bridgeman, CNM, [place] 09/02/2011 10:26 AM

## 2011-09-03 ENCOUNTER — Inpatient Hospital Stay (HOSPITAL_COMMUNITY): Payer: Medicaid Other

## 2011-09-03 LAB — CBC
MCH: 24.3 pg — ABNORMAL LOW (ref 26.0–34.0)
MCV: 75 fL — ABNORMAL LOW (ref 78.0–100.0)
Platelets: 242 10*3/uL (ref 150–400)
RDW: 14.4 % (ref 11.5–15.5)

## 2011-09-03 LAB — DIFFERENTIAL
Basophils Absolute: 0 10*3/uL (ref 0.0–0.1)
Eosinophils Absolute: 0.1 10*3/uL (ref 0.0–0.7)
Eosinophils Relative: 1 % (ref 0–5)

## 2011-09-03 NOTE — Progress Notes (Addendum)
Hospital day # 11 pregnancy at [redacted]w[redacted]d--Twins, PTL  S: Denies any issues, unaware of contractions.  Reports +FM.     No leaking or bleeding.   O: BP 120/63  Pulse 111  Temp(Src) 98.2 F (36.8 C) (Oral)  Resp 20  Ht 5\' 3"  (1.6 m)  Wt 151 lb (68.493 kg)  BMI 26.75 kg/m2  SpO2 99%      UCs this am on monitor, q3-6 min, mild to palpation.  Patient unaware of these.      FHRs with decel during series of 3 rapid contractions to 120s, then episode of tachycardia to 160-170.  Resolved          over several minutes back to 150s with variability.     Dr. Pennie Rushing notified--order received for US/BPP           Chest clear     Heart RRR without murmur     Abd gravid, NT     Ext negative Homans', no edema   A: [redacted]w[redacted]d with twins, PTL     Stable  P: Await results of [place].        Nigel Bridgeman  MD 09/03/2011 1:41 PM    Preliminary ultrasound results show concordant growth and BPP for A &B of 8/8 CBC with diff pending.

## 2011-09-03 NOTE — Progress Notes (Signed)
27 5/[redacted] weeks gestation, with twins/ PTL.  Height  63" Weight 151 Lbs pre-pregnancy weight 115 Lbs.Pre-pregnancy  BMI 20.3  IBW 115 Lbs  Total weight gain 36 Lbs. Weight gain goals 37-54 Lbs.   Estimated needs: 2000-2200 kcal/day, 65-80 grams protein/day, 2.2 liters fluid/day Regular diet tolerated well, appetite good. Current diet prescription will provide for increased needs. No abnormal nutrition related labs  Nutrition Dx: Increased nutrient needs r/t pregnancy and fetal growth requirements aeb 27 weeks twin gestation.  No educational needs assessed at this time.

## 2011-09-03 NOTE — Progress Notes (Signed)
Emptied foley 1200cc's

## 2011-09-03 NOTE — Progress Notes (Signed)
Emptied 1500 out of foley

## 2011-09-04 MED ORDER — IBUPROFEN 800 MG PO TABS
800.0000 mg | ORAL_TABLET | Freq: Once | ORAL | Status: AC
  Administered 2011-09-04: 800 mg via ORAL
  Filled 2011-09-04: qty 1

## 2011-09-04 MED ORDER — NIFEDIPINE 10 MG PO CAPS
10.0000 mg | ORAL_CAPSULE | Freq: Four times a day (QID) | ORAL | Status: DC
  Administered 2011-09-04 – 2011-09-05 (×3): 10 mg via ORAL
  Filled 2011-09-04 (×4): qty 1

## 2011-09-04 NOTE — Progress Notes (Signed)
Patient ID: [place], female   DOB: [birth year], 21 y.o.   MRN: [id number] Pt without complaints.  No leakage of fluid or VB.  Good FM  BP 118/73  Pulse 110  Temp(Src) 98.1 F (36.7 C) (Oral)  Resp 18  Ht 5\' 3"  (1.6 m)  Wt 68.493 kg (151 lb)  BMI 26.75 kg/m2  SpO2 99%  FHTS Baseline: A 150 B 155 bpm, Variability: Good {> 6 bpm), Accelerations: Non-reactive but appropriate for gestational age, Decelerations: Variable: moderate and with good recovery for baby b  Toco regular, every 10 minutes  Pt in NAD CV RRR Lungs CTAB abd  Gravid soft and NT GU no vb EXt no calf tenderness Results for orders placed during the hospital encounter of 08/23/11 (from the past 72 hour(s))  CBC     Status: Abnormal   Collection Time   09/03/11  3:15 PM      Component Value Range Comment   WBC 16.7 (*) 4.0 - 10.5 (K/uL)    RBC 4.24  3.87 - 5.11 (MIL/uL)    Hemoglobin 10.3 (*) 12.0 - 15.0 (g/dL)    HCT 30.8 (*) 65.7 - 46.0 (%)    MCV 75.0 (*) 78.0 - 100.0 (fL)    MCH 24.3 (*) 26.0 - 34.0 (pg)    MCHC 32.4  30.0 - 36.0 (g/dL)    RDW 84.6  96.2 - 95.2 (%)    Platelets 242  150 - 400 (K/uL)   DIFFERENTIAL     Status: Abnormal   Collection Time   09/03/11  3:15 PM      Component Value Range Comment   Neutrophils Relative 81 (*) 43 - 77 (%)    Neutro Abs 13.5 (*) 1.7 - 7.7 (K/uL)    Lymphocytes Relative 12  12 - 46 (%)    Lymphs Abs 2.0  0.7 - 4.0 (K/uL)    Monocytes Relative 6  3 - 12 (%)    Monocytes Absolute 1.1 (*) 0.1 - 1.0 (K/uL)    Eosinophils Relative 1  0 - 5 (%)    Eosinophils Absolute 0.1  0.0 - 0.7 (K/uL)    Basophils Relative 0  0 - 1 (%)    Basophils Absolute 0.0  0.0 - 0.1 (K/uL)     Assessment and Plan [redacted]w[redacted]d  PTL with increase in frequency of contractions Procardia q6 [place] 4/1 A 2-8 50%  B 2-3 30% If contractions slow in frequency consider dc of foley in am and heplock IVF Then advance activity slowly Pt agrees with this plan

## 2011-09-04 NOTE — Progress Notes (Signed)
IV out pt do not want it restarted at this hour.

## 2011-09-05 ENCOUNTER — Encounter (HOSPITAL_COMMUNITY): Payer: Self-pay | Admitting: Anesthesiology

## 2011-09-05 ENCOUNTER — Inpatient Hospital Stay (HOSPITAL_COMMUNITY): Payer: Medicaid Other | Admitting: Anesthesiology

## 2011-09-05 ENCOUNTER — Encounter (HOSPITAL_COMMUNITY): Payer: Self-pay

## 2011-09-05 ENCOUNTER — Encounter (HOSPITAL_COMMUNITY)
Admission: AD | Disposition: A | Discharge status: Home / Self Care | Payer: Self-pay | Source: Ambulatory Visit | Attending: Obstetrics and Gynecology

## 2011-09-05 DIAGNOSIS — O30009 Twin pregnancy, unspecified number of placenta and unspecified number of amniotic sacs, unspecified trimester: Secondary | ICD-10-CM

## 2011-09-05 DIAGNOSIS — O3090 Multiple gestation, unspecified, unspecified trimester: Secondary | ICD-10-CM

## 2011-09-05 DIAGNOSIS — O329XX0 Maternal care for malpresentation of fetus, unspecified, not applicable or unspecified: Secondary | ICD-10-CM

## 2011-09-05 SURGERY — Surgical Case
Anesthesia: Regional

## 2011-09-05 MED ORDER — MEPERIDINE HCL 25 MG/ML IJ SOLN: 6.2500 mg | INTRAMUSCULAR | Status: DC | PRN

## 2011-09-05 MED ORDER — PHENYLEPHRINE 40 MCG/ML (10ML) SYRINGE FOR IV PUSH (FOR BLOOD PRESSURE SUPPORT)
PREFILLED_SYRINGE | INTRAVENOUS | Status: AC
  Filled 2011-09-05: qty 5

## 2011-09-05 MED ORDER — ONDANSETRON HCL 4 MG/2ML IJ SOLN: 4.0000 mg | Freq: Three times a day (TID) | INTRAMUSCULAR | Status: DC | PRN

## 2011-09-05 MED ORDER — MORPHINE SULFATE (PF) 0.5 MG/ML IJ SOLN
INTRAMUSCULAR | Status: DC | PRN
  Administered 2011-09-05: 100 ug via INTRATHECAL

## 2011-09-05 MED ORDER — NALOXONE HCL 0.4 MG/ML IJ SOLN: 0.4000 mg | INTRAMUSCULAR | Status: DC | PRN

## 2011-09-05 MED ORDER — LACTATED RINGERS IV SOLN
INTRAVENOUS | Status: DC
  Administered 2011-09-06: 02:00:00 via INTRAVENOUS

## 2011-09-05 MED ORDER — DIPHENHYDRAMINE HCL 50 MG/ML IJ SOLN: 25.0000 mg | INTRAMUSCULAR | Status: DC | PRN

## 2011-09-05 MED ORDER — DIBUCAINE 1 % RE OINT: 1.0000 "application " | TOPICAL_OINTMENT | RECTAL | Status: DC | PRN

## 2011-09-05 MED ORDER — METOCLOPRAMIDE HCL 5 MG/ML IJ SOLN: 10.0000 mg | Freq: Three times a day (TID) | INTRAMUSCULAR | Status: DC | PRN

## 2011-09-05 MED ORDER — MORPHINE SULFATE 0.5 MG/ML IJ SOLN
INTRAMUSCULAR | Status: AC
  Filled 2011-09-05: qty 10

## 2011-09-05 MED ORDER — TETANUS-DIPHTH-ACELL PERTUSSIS 5-2.5-18.5 LF-MCG/0.5 IM SUSP
0.5000 mL | Freq: Once | INTRAMUSCULAR | Status: AC
  Administered 2011-09-06: 0.5 mL via INTRAMUSCULAR
  Filled 2011-09-05: qty 0.5

## 2011-09-05 MED ORDER — SODIUM CHLORIDE 0.9 % IJ SOLN: 3.0000 mL | INTRAMUSCULAR | Status: DC | PRN

## 2011-09-05 MED ORDER — PRENATAL MULTIVITAMIN CH
1.0000 | ORAL_TABLET | Freq: Every day | ORAL | Status: DC
  Administered 2011-09-06 – 2011-09-08 (×3): 1 via ORAL
  Filled 2011-09-05 (×3): qty 1

## 2011-09-05 MED ORDER — PROMETHAZINE HCL 25 MG/ML IJ SOLN: 6.2500 mg | INTRAMUSCULAR | Status: DC | PRN

## 2011-09-05 MED ORDER — CEFAZOLIN SODIUM 1-5 GM-% IV SOLN
INTRAVENOUS | Status: DC | PRN
  Administered 2011-09-05: 2 g via INTRAVENOUS

## 2011-09-05 MED ORDER — FENTANYL CITRATE 0.05 MG/ML IJ SOLN
INTRAMUSCULAR | Status: AC
  Filled 2011-09-05: qty 2

## 2011-09-05 MED ORDER — BUPIVACAINE IN DEXTROSE 0.75-8.25 % IT SOLN
INTRATHECAL | Status: DC | PRN
  Administered 2011-09-05: 1.5 mL via INTRATHECAL

## 2011-09-05 MED ORDER — MAGNESIUM SULFATE 40 G IN LACTATED RINGERS - SIMPLE
2.0000 g/h | INTRAVENOUS | Status: DC
  Filled 2011-09-05: qty 500

## 2011-09-05 MED ORDER — MAGNESIUM SULFATE BOLUS VIA INFUSION
4.0000 g | Freq: Once | INTRAVENOUS | Status: AC
  Administered 2011-09-05: 4 g via INTRAVENOUS
  Filled 2011-09-05: qty 500

## 2011-09-05 MED ORDER — DIPHENHYDRAMINE HCL 50 MG/ML IJ SOLN: 12.5000 mg | INTRAMUSCULAR | Status: DC | PRN

## 2011-09-05 MED ORDER — SIMETHICONE 80 MG PO CHEW: 80.0000 mg | CHEWABLE_TABLET | ORAL | Status: DC | PRN

## 2011-09-05 MED ORDER — SODIUM CHLORIDE 0.9 % IV SOLN: 1.0000 ug/kg/h | INTRAVENOUS | Status: DC | PRN

## 2011-09-05 MED ORDER — OXYTOCIN 10 UNIT/ML IJ SOLN
INTRAMUSCULAR | Status: AC
  Filled 2011-09-05: qty 2

## 2011-09-05 MED ORDER — ONDANSETRON HCL 4 MG/2ML IJ SOLN
INTRAMUSCULAR | Status: DC | PRN
  Administered 2011-09-05: 4 mg via INTRAVENOUS

## 2011-09-05 MED ORDER — OXYTOCIN 20 UNITS IN LACTATED RINGERS INFUSION - SIMPLE
INTRAVENOUS | Status: DC | PRN
  Administered 2011-09-05: 20 [IU] via INTRAVENOUS

## 2011-09-05 MED ORDER — CEFAZOLIN SODIUM 1-5 GM-% IV SOLN
INTRAVENOUS | Status: AC
  Filled 2011-09-05: qty 100

## 2011-09-05 MED ORDER — ACETAMINOPHEN 325 MG PO TABS: 325.0000 mg | ORAL_TABLET | ORAL | Status: DC | PRN

## 2011-09-05 MED ORDER — ONDANSETRON HCL 4 MG PO TABS: 4.0000 mg | ORAL_TABLET | ORAL | Status: DC | PRN

## 2011-09-05 MED ORDER — FENTANYL CITRATE 0.05 MG/ML IJ SOLN
INTRAMUSCULAR | Status: DC | PRN
  Administered 2011-09-05: 25 ug via INTRATHECAL

## 2011-09-05 MED ORDER — CITRIC ACID-SODIUM CITRATE 334-500 MG/5ML PO SOLN
ORAL | Status: AC
  Administered 2011-09-05: 30 mL
  Filled 2011-09-05: qty 15

## 2011-09-05 MED ORDER — PHENYLEPHRINE 40 MCG/ML (10ML) SYRINGE FOR IV PUSH (FOR BLOOD PRESSURE SUPPORT)
PREFILLED_SYRINGE | INTRAVENOUS | Status: AC
  Filled 2011-09-05: qty 10

## 2011-09-05 MED ORDER — NALBUPHINE HCL 10 MG/ML IJ SOLN
5.0000 mg | INTRAMUSCULAR | Status: DC | PRN
Start: 2011-09-05 — End: 2011-09-08
  Filled 2011-09-05: qty 1

## 2011-09-05 MED ORDER — DIPHENHYDRAMINE HCL 25 MG PO CAPS: 25.0000 mg | ORAL_CAPSULE | Freq: Four times a day (QID) | ORAL | Status: DC | PRN

## 2011-09-05 MED ORDER — ONDANSETRON HCL 4 MG/2ML IJ SOLN: 4.0000 mg | INTRAMUSCULAR | Status: DC | PRN

## 2011-09-05 MED ORDER — PHENYLEPHRINE HCL 10 MG/ML IJ SOLN
INTRAMUSCULAR | Status: DC | PRN
  Administered 2011-09-05 (×7): 40 ug via INTRAVENOUS
  Administered 2011-09-05 (×2): 80 ug via INTRAVENOUS
  Administered 2011-09-05 (×2): 40 ug via INTRAVENOUS

## 2011-09-05 MED ORDER — IBUPROFEN 600 MG PO TABS
600.0000 mg | ORAL_TABLET | Freq: Four times a day (QID) | ORAL | Status: DC
  Administered 2011-09-05 – 2011-09-08 (×11): 600 mg via ORAL
  Filled 2011-09-05 (×10): qty 1

## 2011-09-05 MED ORDER — NALBUPHINE HCL 10 MG/ML IJ SOLN
5.0000 mg | INTRAMUSCULAR | Status: DC | PRN
  Filled 2011-09-05: qty 1

## 2011-09-05 MED ORDER — OXYCODONE-ACETAMINOPHEN 5-325 MG PO TABS
1.0000 | ORAL_TABLET | ORAL | Status: DC | PRN
  Administered 2011-09-06 (×2): 1 via ORAL
  Administered 2011-09-06: 2 via ORAL
  Administered 2011-09-06 (×2): 1 via ORAL
  Administered 2011-09-07 (×2): 2 via ORAL
  Administered 2011-09-07: 1 via ORAL
  Administered 2011-09-07 (×2): 2 via ORAL
  Administered 2011-09-08: 1 via ORAL
  Administered 2011-09-08: 2 via ORAL
  Filled 2011-09-05: qty 1
  Filled 2011-09-05 (×3): qty 2
  Filled 2011-09-05: qty 1
  Filled 2011-09-05: qty 2
  Filled 2011-09-05: qty 1
  Filled 2011-09-05 (×3): qty 2
  Filled 2011-09-05 (×2): qty 1

## 2011-09-05 MED ORDER — OXYTOCIN 20 UNITS IN LACTATED RINGERS INFUSION - SIMPLE
125.0000 mL/h | INTRAVENOUS | Status: AC
  Filled 2011-09-05 (×2): qty 1000

## 2011-09-05 MED ORDER — LACTATED RINGERS IV SOLN
INTRAVENOUS | Status: DC | PRN
  Administered 2011-09-05 (×3): via INTRAVENOUS

## 2011-09-05 MED ORDER — KETOROLAC TROMETHAMINE 30 MG/ML IJ SOLN
30.0000 mg | Freq: Four times a day (QID) | INTRAMUSCULAR | Status: DC | PRN
  Administered 2011-09-05: 30 mg via INTRAVENOUS

## 2011-09-05 MED ORDER — ACETAMINOPHEN 10 MG/ML IV SOLN: 1000.0000 mg | Freq: Four times a day (QID) | INTRAVENOUS | Status: AC | PRN

## 2011-09-05 MED ORDER — SENNOSIDES-DOCUSATE SODIUM 8.6-50 MG PO TABS
2.0000 | ORAL_TABLET | Freq: Every day | ORAL | Status: DC
  Administered 2011-09-05 – 2011-09-07 (×3): 2 via ORAL

## 2011-09-05 MED ORDER — KETOROLAC TROMETHAMINE 30 MG/ML IJ SOLN: 30.0000 mg | Freq: Four times a day (QID) | INTRAMUSCULAR | Status: DC | PRN

## 2011-09-05 MED ORDER — FENTANYL CITRATE 0.05 MG/ML IJ SOLN: 25.0000 ug | INTRAMUSCULAR | Status: DC | PRN

## 2011-09-05 MED ORDER — WITCH HAZEL-GLYCERIN EX PADS: 1.0000 "application " | MEDICATED_PAD | CUTANEOUS | Status: DC | PRN

## 2011-09-05 MED ORDER — SCOPOLAMINE 1 MG/3DAYS TD PT72
1.0000 | MEDICATED_PATCH | Freq: Once | TRANSDERMAL | Status: DC
  Filled 2011-09-05: qty 1

## 2011-09-05 MED ORDER — MENTHOL 3 MG MT LOZG: 1.0000 | LOZENGE | OROMUCOSAL | Status: DC | PRN

## 2011-09-05 MED ORDER — ZOLPIDEM TARTRATE 5 MG PO TABS: 5.0000 mg | ORAL_TABLET | Freq: Every evening | ORAL | Status: DC | PRN

## 2011-09-05 MED ORDER — KETOROLAC TROMETHAMINE 30 MG/ML IJ SOLN
INTRAMUSCULAR | Status: AC
  Filled 2011-09-05: qty 1

## 2011-09-05 MED ORDER — DIPHENHYDRAMINE HCL 25 MG PO CAPS: 25.0000 mg | ORAL_CAPSULE | ORAL | Status: DC | PRN

## 2011-09-05 MED ORDER — LANOLIN HYDROUS EX OINT: 1.0000 "application " | TOPICAL_OINTMENT | CUTANEOUS | Status: DC | PRN

## 2011-09-05 MED ORDER — MIDAZOLAM HCL 2 MG/2ML IJ SOLN: 0.5000 mg | Freq: Once | INTRAMUSCULAR | Status: DC | PRN

## 2011-09-05 MED ORDER — SIMETHICONE 80 MG PO CHEW
80.0000 mg | CHEWABLE_TABLET | Freq: Three times a day (TID) | ORAL | Status: DC
  Administered 2011-09-05 – 2011-09-08 (×9): 80 mg via ORAL

## 2011-09-05 MED ORDER — ONDANSETRON HCL 4 MG/2ML IJ SOLN
INTRAMUSCULAR | Status: AC
  Filled 2011-09-05: qty 2

## 2011-09-05 SURGICAL SUPPLY — 32 items
BENZOIN TINCTURE PRP APPL 2/3 (GAUZE/BANDAGES/DRESSINGS) ×2 IMPLANT
CHLORAPREP W/TINT 26ML (MISCELLANEOUS) ×2 IMPLANT
CLOTH BEACON ORANGE TIMEOUT ST (SAFETY) ×2 IMPLANT
CONTAINER PREFILL 10% NBF 15ML (MISCELLANEOUS) IMPLANT
ELECT REM PT RETURN 9FT ADLT (ELECTROSURGICAL) ×2
ELECTRODE REM PT RTRN 9FT ADLT (ELECTROSURGICAL) ×1 IMPLANT
EXTRACTOR VACUUM M CUP 4 TUBE (SUCTIONS) IMPLANT
GLOVE BIO SURGEON STRL SZ7.5 (GLOVE) ×4 IMPLANT
GLOVE BIOGEL PI IND STRL 7.5 (GLOVE) ×1 IMPLANT
GLOVE BIOGEL PI INDICATOR 7.5 (GLOVE) ×1
GOWN PREVENTION PLUS LG XLONG (DISPOSABLE) ×6 IMPLANT
KIT ABG SYR 3ML LUER SLIP (SYRINGE) ×8 IMPLANT
NEEDLE HYPO 22GX1.5 SAFETY (NEEDLE) IMPLANT
NEEDLE HYPO 25X5/8 SAFETYGLIDE (NEEDLE) IMPLANT
NS IRRIG 1000ML POUR BTL (IV SOLUTION) ×2 IMPLANT
PACK C SECTION WH (CUSTOM PROCEDURE TRAY) ×2 IMPLANT
RETRACTOR WND ALEXIS 25 LRG (MISCELLANEOUS) ×1 IMPLANT
RTRCTR C-SECT PINK 25CM LRG (MISCELLANEOUS) ×2 IMPLANT
RTRCTR WOUND ALEXIS 25CM LRG (MISCELLANEOUS) ×2
SLEEVE SCD COMPRESS KNEE MED (MISCELLANEOUS) IMPLANT
STRIP CLOSURE SKIN 1/2X4 (GAUZE/BANDAGES/DRESSINGS) ×2 IMPLANT
SUT CHROMIC 2 0 CT 1 (SUTURE) ×2 IMPLANT
SUT MNCRL AB 3-0 PS2 27 (SUTURE) ×2 IMPLANT
SUT PLAIN 0 NONE (SUTURE) IMPLANT
SUT PLAIN 2 0 XLH (SUTURE) ×2 IMPLANT
SUT VIC AB 0 CT1 36 (SUTURE) ×2 IMPLANT
SUT VIC AB 0 CTX 36 (SUTURE) ×3
SUT VIC AB 0 CTX36XBRD ANBCTRL (SUTURE) ×3 IMPLANT
SYR CONTROL 10ML LL (SYRINGE) IMPLANT
TOWEL OR 17X24 6PK STRL BLUE (TOWEL DISPOSABLE) ×4 IMPLANT
TRAY FOLEY CATH 14FR (SET/KITS/TRAYS/PACK) ×4 IMPLANT
WATER STERILE IRR 1000ML POUR (IV SOLUTION) ×2 IMPLANT

## 2011-09-05 NOTE — Op Note (Signed)
Cesarean Section Procedure Note  Indications: 28 1/7 wks with twins in PTL.  Called by RN secondary pt continuing to have regular contractions despite procardia and then magnesium sulfate for neuroprophylaxis.  Pt was examined by me and found to have bulging membranes to the introitus with fetal head presenting at introitus and bullotable.  Pre-operative Diagnosis: 1. 28 1/7wks 2. Preterm fully dilated 3. Malpresentation   Post-operative Diagnosis: same  Procedure: CESAREAN SECTION  Surgeon: Osborn Coho, MD   Assistants: surgical tech  Anesthesia: Regional  Anesthesiologist: Brayton Caves, MD   Procedure Details  The patient was taken to the operating room secondary to preterm labor (fully dilated and vertex, breech twins) after the risks, benefits, complications, treatment options, and expected outcomes were discussed with the patient.  The patient concurred with the proposed plan, giving informed consent which was signed and witnessed. The patient was taken to Operating Room 1, identified as [name] [name] and the procedure verified as C-Section Delivery. A Time Out was held and the above information confirmed.  After induction of anesthesia by obtaining a spinal, the patient was prepped and draped in the usual sterile manner. Total of 2gms of Ancef was infusing.  A Pfannenstiel skin incision was made and carried down through the subcutaneous tissue to the underlying layer of fascia.  The fascia was incised bilaterally and extended transversely bilaterally with the Mayo scissors. Kocher clamps were placed on the inferior aspect of the fascial incision and the underlying rectus muscle was separated from the fascia. The same was done on the superior aspect of the fascial incision.  The peritoneum was identified, entered bluntly and extended manually. The utero-vesical peritoneal reflection was incised transversely and the bladder flap was bluntly freed from the lower uterine segment. A low  transverse uterine incision was made with the scalpel and extended bilaterally with the bandage scissors.  Baby A was delivered in vertex position without difficulty after rupturing membranes and clear fluid noted.  After the umbilical cord was clamped (x1) and cut, the infant was handed to the awaiting pediatricians.  Cord blood and venous cord ph were obtained for evaluation.  Baby B was then delivered via breech extraction after rupturing membranes with clear fluid noted.  After the umbilical cord was clamped (x2) and cut, the infant was handed to the awaiting pediatricians.  Cord blood, venous and arterial cord ph were obtained for evaluation. The placentas were removed intact and appeared to be within normal limits. The uterus was cleared of all clots and debris. The uterine incision was closed with running interlocking sutures of 0 Vicryl and a second imbricating layer was performed as well.   Bilateral tubes and ovaries appeared to be within normal limits.  Good hemostasis was noted.  Copious irrigation was performed until clear.  The peritoneum was repaired with 2-0 chromic via a running suture.  The fascia was reapproximated with a running suture of 0 Vicryl. The subcutaneous tissue was reapproximated with 3 interrupted sutures of 2-0 plain.  The skin was reapproximated with a subcuticular suture of 3-0 monocryl.  Steristrips were applied.  Instrument, sponge, and needle counts were correct prior to abdominal closure and at the conclusion of the case.  The patient was awaiting transfer to the recovery room in good condition.  Findings: Baby A (vtx): Live female infant with Apgars 8 at one minute and 9 at five minutes.  Baby B (breech): Live female infant with Apgars 7 at one minute and 8 at five minutes.  Normal  appearing bilateral ovaries and fallopian tubes were noted.  Estimated Blood Loss:  800 ml         Drains: foley to gravity 200 ml of clear urine         Total IV Fluids: 2400 ml           Specimens to Pathology: Placenta         Complications:  None; patient tolerated the procedure well.         Disposition: PACU - hemodynamically stable.         Condition: stable  Attending Attestation: I performed the procedure.

## 2011-09-05 NOTE — Transfer of Care (Signed)
Immediate Anesthesia Transfer of Care Note  Patient: [name] [name]  Procedure(s) Performed: Procedure(s) (LRB): CESAREAN SECTION (N/A)  Patient Location: PACU  Anesthesia Type: Spinal  Level of Consciousness: awake  Airway & Oxygen Therapy: Patient Spontanous Breathing  Post-op Assessment: Report given to PACU RN  Post vital signs: Reviewed and stable  Complications: No apparent anesthesia complications

## 2011-09-05 NOTE — Anesthesia Postprocedure Evaluation (Signed)
  Anesthesia Post-op Note  Patient: [name] [name]  Procedure(s) Performed: Procedure(s) (LRB): CESAREAN SECTION (N/A)  Patient is awake, responsive, moving her legs, and has signs of resolution of her numbness. Pain and nausea are reasonably well controlled. Vital signs are stable and clinically acceptable. Oxygen saturation is clinically acceptable. There are no apparent anesthetic complications at this time. Patient is ready for discharge.

## 2011-09-05 NOTE — Progress Notes (Signed)
[name] [name] is a 21 y.o. G1P0104 at [redacted]w[redacted]d admitted for Preterm labor.  Called by RN earlier who reported pt feeling ctxs q5 min and not responding to procardia.  I instructed RN to d/c procardia and start MgSO4 for Neuroprophylaxis.  RN called back about and hour or so later stating that the patient was still not getting in relief and appeared to uncomfortable.  No LOF and no VB.  Subjective: Feels painful contractions.  Objective: BP 127/70  Pulse 90  Temp(Src) 97.9 F (36.6 C) (Oral)  Resp 16  Ht 5\' 3"  (1.6 m)  Wt 145 lb (65.772 kg)  BMI 25.69 kg/m2  SpO2 99%  Breastfeeding? Unknown I/O last 3 completed shifts: In: -  Out: 5200 [Urine:5200] Total I/O In: 3410.6 [P.O.:240; I.V.:3170.6] Out: 3300 [Urine:2500; Blood:800]  FHT: FHR x 2 being obtained by RN. 150s x 2. UC:   regular, every 5 minutes SVE:   Dilation: 10 Exam by:: Dr Su Hilt Bulging membranes to introitus and twin A fetal head bullotable at introitus palpable through membranes.  Labs: Lab Results  Component Value Date   WBC 16.7* 09/03/2011   HGB 10.3* 09/03/2011   HCT 31.8* 09/03/2011   MCV 75.0* 09/03/2011   PLT 242 09/03/2011    Assessment / Plan: Preterm Labor at 28 1/7wks with Twins.  I discussed recommendation for proceeding with C-Section now.  Pt is agreeable.  The risks, benefits and alternatives were discussed with the patient and consent signed and witnessed.  Fetal status x 2 is overall reassuring.   Purcell Nails 09/05/2011, 6:33 PM

## 2011-09-05 NOTE — Progress Notes (Signed)
UR chart review completed.  

## 2011-09-05 NOTE — Anesthesia Preprocedure Evaluation (Signed)

## 2011-09-05 NOTE — Anesthesia Procedure Notes (Signed)
Spinal  Patient location during procedure: OR Start time: 09/05/2011 12:39 PM Staffing Anesthesiologist: Brayton Caves R Performed by: anesthesiologist  Preanesthetic Checklist Completed: patient identified, site marked, surgical consent, pre-op evaluation, timeout performed, IV checked, risks and benefits discussed and monitors and equipment checked Spinal Block Patient position: sitting Prep: DuraPrep Patient monitoring: heart rate, cardiac monitor, continuous pulse ox and blood pressure Approach: midline Location: L3-4 Injection technique: single-shot Needle Needle type: Sprotte  Needle gauge: 24 G Needle length: 9 cm Assessment Sensory level: T4 Additional Notes Patient identified.  Risk benefits discussed including failed block, incomplete pain control, headache, nerve damage, paralysis, blood pressure changes, nausea, vomiting, reactions to medication both toxic or allergic, and postpartum back pain.  Patient expressed understanding and wished to proceed.  All questions were answered.  Sterile technique used throughout procedure.  CSF was clear.  No parasthesia or other complications.  Please see nursing notes for vital signs.

## 2011-09-06 LAB — CBC
HCT: 32.7 % — ABNORMAL LOW (ref 36.0–46.0)
MCH: 23.9 pg — ABNORMAL LOW (ref 26.0–34.0)
MCHC: 31.8 g/dL (ref 30.0–36.0)
MCV: 75.2 fL — ABNORMAL LOW (ref 78.0–100.0)
Platelets: 262 10*3/uL (ref 150–400)
RDW: 14.5 % (ref 11.5–15.5)
WBC: 27.6 10*3/uL — ABNORMAL HIGH (ref 4.0–10.5)

## 2011-09-06 NOTE — Anesthesia Postprocedure Evaluation (Signed)
  Anesthesia Post-op Note  Patient: Merchandiser, retail) Performed: Procedure(s) (LRB): CESAREAN SECTION (N/A)  Patient Location: [facility] Unit  Anesthesia Type: Spinal  Level of Consciousness: awake  Airway and Oxygen Therapy: Patient Spontanous Breathing  Post-op Pain: none  Post-op Assessment: Patient's Cardiovascular Status Stable, Respiratory Function Stable, Patent Airway, No signs of Nausea or vomiting, Adequate PO intake, Pain level controlled, No headache, No backache, No residual numbness and No residual motor weakness  Post-op Vital Signs: Reviewed and stable  Complications: No apparent anesthesia complications

## 2011-09-06 NOTE — Addendum Note (Signed)
Addendum  created 09/06/11 0930 by Suella Grove, CRNA   Modules edited:Notes Section

## 2011-09-06 NOTE — Progress Notes (Signed)
Subjective: Postpartum Day 1 Cesarean Delivery Patient reports tolerating PO and no problems voiding not passing gas yet, pumping breasts, babies doing well, pain pills are working, going to see babies now.  Objective: Vital signs in last 24 hours: Temp:  [97.9 F (36.6 C)-98.4 F (36.9 C)] 98.4 F (36.9 C) (04/04 0551) Pulse Rate:  [80-123] 95  (04/04 0551) Resp:  [16-18] 16  (04/04 0551) BP: (102-152)/(51-84) 114/70 mmHg (04/04 0551) SpO2:  [96 %-100 %] 96 % (04/04 0551) Weight:  [65.772 kg (145 lb)-66.543 kg (146 lb 11.2 oz)] 65.772 kg (145 lb) (04/03 1551)  Physical Exam:  General: alert, cooperative and no distress Lochia: appropriate Uterine Fundus: firm Incision: dressing with dry with exception of area circled not increased DVT Evaluation: Negative Homan's sign. No significant calf/ankle edema. Lungs clear bilaterally, AP RRR, abd softly distended, bowel sounds hypoactive  Basename 09/06/11 0545 09/03/11 1515  HGB 10.4* 10.3*  HCT 32.7* 31.8*    Assessment/Plan: Status post Cesarean section. Doing well postoperatively.  Continue current care.  [name] [name] 09/06/2011, 9:20 AM

## 2011-09-07 ENCOUNTER — Encounter (HOSPITAL_COMMUNITY): Payer: Self-pay | Admitting: Obstetrics and Gynecology

## 2011-09-07 MED ORDER — RHO D IMMUNE GLOBULIN 1500 UNIT/2ML IJ SOLN
300.0000 ug | Freq: Once | INTRAMUSCULAR | Status: AC
  Administered 2011-09-07: 300 ug via INTRAMUSCULAR
  Filled 2011-09-07: qty 2

## 2011-09-07 NOTE — Progress Notes (Signed)
Clinical Social Work Department PSYCHOSOCIAL ASSESSMENT - MATERNAL/CHILD 09/07/2011  Patient:  [name],[name]  Account Number:  [id number]  Admit Date:  08/23/2011  Childs Name:   [name] [name]  B-[name] [name]    Clinical Social Worker:  Tyshae Stair, LCSW   Date/Time:  09/07/2011 01:45 PM  Date Referred:  09/07/2011   Referral source  NICU     Referred reason  NICU   Other referral source:    I:  FAMILY / HOME ENVIRONMENT Child's legal guardian:  PARENT  Guardian - Name Guardian - Age Guardian - Address  [name] [name] 20 [address] [place], [place] [address]  [name] [name] 21 same   Other household support members/support persons Name Relationship DOB  [name] [name] GRAND MOTHER    Other support:   [name] reports having a great support system.    II  PSYCHOSOCIAL DATA Information Source:  Patient Interview  Financial and Community Resources Employment:   [name] works at [employer]  FOB works at 2 convenient stores   Financial resources:  Medicaid If Medicaid - County:  [place]  School / Grade:   Maternity Care Coordinator / Child Services Coordination / Early Interventions:  Cultural issues impacting care:   none known    III  STRENGTHS Strengths  Adequate Resources  Compliance with medical plan  Supportive family/friends  Understanding of illness   Strength comment:  SW gave pediatrician list   IV  RISK FACTORS AND CURRENT PROBLEMS Current Problem:  None   Risk Factor & Current Problem Patient Issue Family Issue Risk Factor / Current Problem Comment   N N     V  SOCIAL WORK ASSESSMENT SW met with [name] in her third floor room/310 to introduce myself, complete assessment and evaluate how family is coping with babies' premature births and admissions to NICU.  [name] was very pleasant and states that she is doing well.  She states she is ready to get out of the hospital. She seems to have a good understanding of the situation and appears to be  coping well.  SW explained baby B's eligibility for SSI and [name] wants to apply.  We completed applications for both babies and submitted to [employer].  [name] states she lives 15-20 minutes from the hospital and will not have any issues with transportation after her discharge.  She reports she and FOB are in a relationship and he is supportive.  She states he is doing well also. They have not gotten baby supplies yet, but [name] states she still plans to have a baby shower and believes they have the means to get what they need.  She will let SW know if they are having trouble.  [name] states no questions or needs at this time.  SW explained support services offered by NICU SW.  SW discussed common emotions related to NICU admissions and encouraged [name] to contact SW if she has any questions, concerns or needs in the future.  SW has no social concerns at this time.      VI SOCIAL WORK PLAN  Type of pt/family education:   If child protective services report - county:   If child protective services report - date:   Information/referral to community resources comment:   SSI   Other social work plan:      

## 2011-09-07 NOTE — Progress Notes (Signed)
Subjective: Postpartum Day 2Cesarean Delivery Patient reports tolerating PO, + flatus and no problems voiding.    Objective: Vital signs in last 24 hours: Temp:  [97.7 F (36.5 C)-98.5 F (36.9 C)] 97.7 F (36.5 C) (04/05 1800) Pulse Rate:  [87-101] 100  (04/05 1800) Resp:  [18-20] 20  (04/05 1800) BP: (101-131)/(65-79) 101/67 mmHg (04/05 1800) SpO2:  [98 %-100 %] 98 % (04/05 1800)  Physical Exam:  General: alert, cooperative and no distress Lochia: appropriate Uterine Fundus: firm Incision: healing well, no significant drainage, no dehiscence, no significant erythema DVT Evaluation: No evidence of DVT seen on physical exam.   Basename 09/06/11 0545  HGB 10.4*  HCT 32.7*    Assessment/Plan: Status post Cesarean section. Doing well postoperatively.  Continue current care.  Topacio Cella P 09/07/2011, 6:26 PM

## 2011-09-08 DIAGNOSIS — D649 Anemia, unspecified: Secondary | ICD-10-CM | POA: Diagnosis present

## 2011-09-08 LAB — RH IG WORKUP (INCLUDES ABO/RH)
ABO/RH(D): B NEG
Fetal Screen: NEGATIVE
Gestational Age(Wks): 28

## 2011-09-08 MED ORDER — DIBUCAINE 1 % RE OINT: 1.0000 "application " | TOPICAL_OINTMENT | RECTAL | Status: DC | PRN

## 2011-09-08 MED ORDER — OXYCODONE-ACETAMINOPHEN 5-325 MG PO TABS: 1.0000 | ORAL_TABLET | ORAL | Status: AC | PRN

## 2011-09-08 MED ORDER — IBUPROFEN 600 MG PO TABS: 600.0000 mg | ORAL_TABLET | Freq: Four times a day (QID) | ORAL | Status: AC

## 2011-09-08 NOTE — Consult Note (Signed)
Mother is very motivated to provide breastmilk for her twin boys in NICU. She is using the DEBP and expressing 10 ml per breast. Mother is getting a DEBP from [facility] next week and in the mean time has a Pump in style DEBP. Due to the prematuirty of the twins, advised to use the [facility] pump for best stimulation. Mother was taught hand expression and produced a large volume of colostum. She returned demo. Discussed the benefits of hand expression post pumping to increase milk volume. Mother will follow up with Esaw Dace, [facility] in NICU wil Kendell Bane and Enid Derry are in NICU.

## 2011-09-08 NOTE — Discharge Summary (Signed)
Obstetric Discharge Summary Reason for Admission: preterm labor Prenatal Procedures: NST, ultrasound and steroids. Bedrest and antibiotics Intrapartum Procedures: cesarean: low cervical, transverse Postpartum Procedures: none Complications-Operative and Postpartum: none Hemoglobin  Date Value Range Status  09/06/2011 10.4* 12.0-15.0 (g/dL) Final     HCT  Date Value Range Status  09/06/2011 32.7* 36.0-46.0 (%) Final  Hospital Course:  The patient was admitted in preterm labor with cervix dilated to 4 cm.  She was started on Magnesium sulfate, antibiotics to await culture results, and given a 2 dose course of Betamethasone.  Magnesium was discontinued on 3/25.  She remained on bedrest until she began active labor on 09/05/11 and underwent a primary low transverse c-sectionl.  Her post op course was uneventful.  By day 3 she was deemed to have received maximal hospital benefit and be ready for discharge home with the po pain management which had worked well for her in house.  Physical Exam:  General: alert and no distress Lochia: appropriate Uterine Fundus: firm Incision: healing well, no significant drainage, no significant erythema DVT Evaluation: No evidence of DVT seen on physical exam.  Discharge Diagnoses: Premature labor and Twin gestation.  Abnormal presentation  Discharge Information: Date: 09/08/2011 Activity: pelvic rest Diet: routine Medications: see AVS Condition: stable and improved Instructions: refer to practice specific booklet Discharge to: home Follow-up Information    Follow up with Purcell Nails, MD. Schedule an appointment as soon as possible for a visit in 6 weeks.   Contact information:   [address] [place] [address] [phone]          Newborn Data:   Jameelah, Watts  [place] [[id number]]  Live born female  Birth Weight: 2 lb 13.6 oz (1294 g) APGAR: 8, 9   [place], [place] [[id number]]  Live born female  Birth Weight: 2 lb  1.7 oz (955 g) APGAR: 7, 8   Twins remain in the NICU for continued care.  Jacquelyne Quarry P 09/08/2011, 10:07 AM

## 2011-09-08 NOTE — Consult Note (Deleted)
Mother for discharge today. Mother has been committed to pumping and expressing 30 ml. She is very excited. Discussed milk collection, pumping and storage. Mother will get a DEBP from [facility] this upcoming week and in the mean time has a hospital loaner pump. Encouraged and praised for providing breast milk to her baby. To ask for assist as needed in the [facility] and [place] [facility], [facility] will follow the baby in the NICU.

## 2011-10-11 ENCOUNTER — Telehealth: Payer: Self-pay | Admitting: Obstetrics and Gynecology

## 2011-10-11 NOTE — Telephone Encounter (Signed)
Triage electron

## 2011-10-11 NOTE — Telephone Encounter (Signed)
This AR pt

## 2011-10-11 NOTE — Telephone Encounter (Signed)
Spoke to pt and got her sched for ppv on 10/15/2011. Melody Comas A

## 2011-10-15 ENCOUNTER — Encounter: Payer: Self-pay | Admitting: Obstetrics and Gynecology

## 2011-10-15 ENCOUNTER — Ambulatory Visit (INDEPENDENT_AMBULATORY_CARE_PROVIDER_SITE_OTHER): Payer: Medicaid Other | Admitting: Obstetrics and Gynecology

## 2011-10-15 VITALS — BP 100/60 | Temp 98.3°F | Ht 63.0 in | Wt 126.0 lb

## 2011-10-15 DIAGNOSIS — Z309 Encounter for contraceptive management, unspecified: Secondary | ICD-10-CM

## 2011-10-15 DIAGNOSIS — Z3200 Encounter for pregnancy test, result unknown: Secondary | ICD-10-CM

## 2011-10-15 NOTE — Progress Notes (Signed)
Addended by: Marla Roe A on: 10/15/2011 04:08 PM   Modules accepted: Orders

## 2011-10-15 NOTE — Progress Notes (Signed)
Date of delivery: 09/05/2011 Female twins Name: [name] [name]/[name] [name] Vaginal delivery:no Cesarean section:yes at 46 1/7wks Tubal ligation:no GDM:no Breast Feeding:yes Bottle Feeding:no Post-Partum Blues:no Abnormal pap:no Normal GU function: yes Normal GI function:yes Returning to work:no  No complaints  Filed Vitals:   10/15/11 1106  BP: 100/60  Temp: 98.3 F (36.8 C)   ROS: noncontributory  Pelvic exam:  VULVA: normal appearing vulva with no masses, tenderness or lesions,  VAGINA: normal appearing vagina with normal color and discharge, no lesions, CERVIX: normal appearing cervix without discharge or lesions,  UTERUS: uterus is normal size, shape, consistency and nontender,  ADNEXA: normal adnexa in size, nontender and no masses.  Results for orders placed in visit on 10/15/11  POCT URINE PREGNANCY      Component Value Range   Preg Test, Ur Negative     A/P Wants Mirena - had unprotected IC 3days ago Sched Mirena in 3wks with neg UPT today and then GC/CT with consent pre IUD Last Pap 05/2011

## 2011-10-16 LAB — GC/CHLAMYDIA PROBE AMP, GENITAL: GC Probe Amp, Genital: NEGATIVE

## 2011-10-18 ENCOUNTER — Ambulatory Visit: Payer: Medicaid Other | Admitting: Obstetrics and Gynecology

## 2011-10-19 ENCOUNTER — Encounter: Payer: Medicaid Other | Admitting: Obstetrics and Gynecology

## 2011-11-08 ENCOUNTER — Encounter: Payer: Self-pay | Admitting: Obstetrics and Gynecology

## 2011-11-08 ENCOUNTER — Ambulatory Visit (INDEPENDENT_AMBULATORY_CARE_PROVIDER_SITE_OTHER): Payer: Medicaid Other | Admitting: Obstetrics and Gynecology

## 2011-11-08 VITALS — BP 90/50 | Ht 63.0 in | Wt 127.0 lb

## 2011-11-08 DIAGNOSIS — Z30430 Encounter for insertion of intrauterine contraceptive device: Secondary | ICD-10-CM

## 2011-11-08 DIAGNOSIS — Z975 Presence of (intrauterine) contraceptive device: Secondary | ICD-10-CM

## 2011-11-08 LAB — POCT URINE PREGNANCY: Preg Test, Ur: NEGATIVE

## 2011-11-08 MED ORDER — LEVONORGESTREL 20 MCG/24HR IU IUD: 1.0000 | INTRAUTERINE_SYSTEM | Freq: Once | INTRAUTERINE | Status: DC

## 2011-11-08 MED ORDER — LEVONORGESTREL 20 MCG/24HR IU IUD
INTRAUTERINE_SYSTEM | Freq: Once | INTRAUTERINE | Status: AC
  Administered 2011-11-08: 10:00:00 via INTRAUTERINE

## 2011-11-08 NOTE — Progress Notes (Signed)
IUD: Mirena LOT#: see MAR Exp: see MAR UPT: negative GC/CHLAMYDIA: negative 10/15/11 CONSENT SIGNED: yes DISINFECTION WITH betadine X3 UTERUS SOUNDED AT 7.5 CM IUD INSERTED PER PROTOCOL: yes COMPLICATION: none PATIENT INSTRUCTED TO CALL IS FEVER OR ABNORMAL PAIN:yes PATIENT INSTRUCTED ON HOW TO CHECK IUD STRINGS: yes FOLLOW UP APPT: 5wks

## 2011-12-11 ENCOUNTER — Encounter: Payer: Medicaid Other | Admitting: Obstetrics and Gynecology

## 2012-05-30 ENCOUNTER — Ambulatory Visit (INDEPENDENT_AMBULATORY_CARE_PROVIDER_SITE_OTHER): Payer: Commercial Indemnity | Admitting: Obstetrics and Gynecology

## 2012-05-30 ENCOUNTER — Telehealth: Payer: Self-pay | Admitting: Obstetrics and Gynecology

## 2012-05-30 ENCOUNTER — Encounter: Payer: Self-pay | Admitting: Obstetrics and Gynecology

## 2012-05-30 VITALS — BP 100/70 | HR 72 | Wt 126.0 lb

## 2012-05-30 DIAGNOSIS — IMO0001 Reserved for inherently not codable concepts without codable children: Secondary | ICD-10-CM

## 2012-05-30 DIAGNOSIS — Z3009 Encounter for other general counseling and advice on contraception: Secondary | ICD-10-CM

## 2012-05-30 DIAGNOSIS — Z309 Encounter for contraceptive management, unspecified: Secondary | ICD-10-CM

## 2012-05-30 MED ORDER — MEDROXYPROGESTERONE ACETATE 150 MG/ML IM SUSP: INTRAMUSCULAR | Status: DC

## 2012-05-30 MED ORDER — MEDROXYPROGESTERONE ACETATE 150 MG/ML IM SUSP
150.0000 mg | Freq: Once | INTRAMUSCULAR | Status: AC
  Administered 2012-05-30: 150 mg via INTRAMUSCULAR

## 2012-05-30 NOTE — Progress Notes (Signed)
21 YO with history of IUD expulsion returns for alternative contraception.  Reviewed options listed on the Contraceptive handout.  Patient wants to use the Depo Provera.  Reviewed MOA, dosing, risks, benefits, side effects and the need to take calcium supplementation.  Patient was agreeable and wants to have it done today.  O:  UPT-negative  A:  Contraceptive consult       H/O IUD Expulsion  P:  Depo Provera 150 mg  IM q 12 weeks 4 refills        Calcium 500 mg bid        RTO-as scheduled or prn  Taejon Irani, PA-C

## 2012-05-30 NOTE — Progress Notes (Signed)
Next Depo due 08-21-2012 

## 2012-05-30 NOTE — Telephone Encounter (Signed)
VM from pt. Awaiting Rx that was to be called. Is at pharmacy.

## 2012-05-30 NOTE — Patient Instructions (Signed)
Take Calcium daily 500 mg twice a day while on Depo Provera

## 2012-05-30 NOTE — Progress Notes (Deleted)
Subjective:     Patient ID: [place], female   DOB: Aug 15, 1990, 21 y.o.   MRN: [id number]  HPI   Review of Systems     Objective:   Physical Exam     Assessment:     ***    Plan:     ***

## 2012-06-03 ENCOUNTER — Encounter: Payer: Self-pay | Admitting: Obstetrics and Gynecology

## 2012-08-27 ENCOUNTER — Other Ambulatory Visit: Payer: Self-pay | Admitting: Obstetrics and Gynecology

## 2012-08-28 ENCOUNTER — Other Ambulatory Visit: Payer: Commercial Indemnity

## 2012-08-28 LAB — GC/CHLAMYDIA PROBE AMP
CT Probe RNA: NEGATIVE
GC Probe RNA: NEGATIVE

## 2013-12-17 ENCOUNTER — Telehealth: Payer: Self-pay | Admitting: Medical

## 2013-12-17 ENCOUNTER — Ambulatory Visit (INDEPENDENT_AMBULATORY_CARE_PROVIDER_SITE_OTHER): Payer: No Typology Code available for payment source | Admitting: Medical

## 2013-12-17 ENCOUNTER — Encounter: Payer: Self-pay | Admitting: Medical

## 2013-12-17 VITALS — BP 100/60 | HR 72 | Temp 98.2°F | Resp 14 | Wt 118.0 lb

## 2013-12-17 DIAGNOSIS — F329 Major depressive disorder, single episode, unspecified: Secondary | ICD-10-CM

## 2013-12-17 DIAGNOSIS — F3289 Other specified depressive episodes: Secondary | ICD-10-CM

## 2013-12-17 DIAGNOSIS — F32A Depression, unspecified: Secondary | ICD-10-CM

## 2013-12-17 MED ORDER — SERTRALINE HCL 50 MG PO TABS: ORAL_TABLET | ORAL | Status: DC

## 2013-12-17 NOTE — Telephone Encounter (Signed)
Refer to counseling.  Start with [name] SkiffLauren [name], see if they can see her.   Counseling services  [facility] [phone] office [email or web address] [address] [place], [place], [place] [id number]  [name], therapist   [facility] Sheran Luzesiree Wilkinson, psychotherapist [address] [place], [place] [id number] [phone]

## 2013-12-17 NOTE — Progress Notes (Signed)
Subjective: Here as a new patient today.  No family doctor in a while.  Primarily sees [facility]cology[facility], Dr. Maisie Fushomas.   Here for c/o depression.  Would like to go back on antidepressant.  Feels like she is getting back into a depression.  Wants to try to head this off before it gets worse.  Was on medication 6-7 years ago, took for about 2 years.  Depression runs both sides of family.  She doesn't recall the medication name, but had no side effects.    Current stress includes job stress, relationships with boyfriend and mom, has 2 twin boys.  Having shift bid at work.  Mom recently kicked her boyfriend out.  He can't be there now for now, and she has lots of responsibilities at the moment.   Works with Clinical biochemistcustomer service at [employer], lives with mom, has twin 2yo boys,   Been with boyfriend, 3 years.  Her mom kicked him out for arguing/fussing.   mom took out restraining order on him.  He had gotten mad at [employer], had an argument about him earning less money than her, stopped her from going to work that day, had been controlling.  No physical abuse or other abuse.     She does attend church, has talked with pastor about these issues.  She denies mood swings, panic attacks.  Sleeps ok.   Does sometimes skip meals.  +anhedonia.   She has been living with mom for years.   Was on her own at one point though  No prior counseling.   ROS as in subjective   Objective: Filed Vitals:   12/17/13 1432  BP: 100/60  Pulse: 72  Temp: 98.2 F (36.8 C)  Resp: 14    General appearance: alert, no distress, WD/WN, AA female Psych: pleasant, good eye contact, answers questions appropriately  Assessment: Encounter Diagnosis  Name Primary?  . Depression Yes   Plan: Her symptoms and concerns seem c/w acute depression.   Begin Sertraline.  Discussed risks/benefits of medication, proper use, f/u.  Will refer to counseling.  Discussed her stressors, ways to deal with stress, job stress, relationship  concerns.  F/u 3-4 wk.

## 2013-12-17 NOTE — Telephone Encounter (Signed)
LMOM TO CB. CLS 

## 2013-12-18 NOTE — Telephone Encounter (Signed)
I called Marjie SkiffLauren Atkinson office and referred this patient over. CLS

## 2014-04-01 ENCOUNTER — Other Ambulatory Visit: Payer: Self-pay | Admitting: Medical

## 2014-04-05 ENCOUNTER — Encounter: Payer: Self-pay | Admitting: Medical
